# Patient Record
Sex: Female | Born: 1960 | Race: Black or African American | Hispanic: No | Marital: Single | Smoking: Current every day smoker
Health system: Southern US, Community
[De-identification: ages and names within clinical notes are randomized; demographics above are authoritative.]

## PROBLEM LIST (undated history)

## (undated) DIAGNOSIS — F329 Major depressive disorder, single episode, unspecified: Secondary | ICD-10-CM

## (undated) DIAGNOSIS — F32A Depression, unspecified: Secondary | ICD-10-CM

## (undated) DIAGNOSIS — J449 Chronic obstructive pulmonary disease, unspecified: Secondary | ICD-10-CM

## (undated) DIAGNOSIS — I10 Essential (primary) hypertension: Secondary | ICD-10-CM

## (undated) DIAGNOSIS — J439 Emphysema, unspecified: Secondary | ICD-10-CM

## (undated) DIAGNOSIS — I519 Heart disease, unspecified: Secondary | ICD-10-CM

## (undated) HISTORY — DX: Heart disease, unspecified: I51.9

## (undated) HISTORY — DX: Emphysema, unspecified: J43.9

---

## 2003-07-16 ENCOUNTER — Other Ambulatory Visit: Payer: Self-pay

## 2007-01-23 ENCOUNTER — Emergency Department: Payer: Self-pay | Admitting: Emergency Medicine

## 2007-07-04 ENCOUNTER — Emergency Department: Payer: Self-pay | Admitting: Emergency Medicine

## 2008-10-16 ENCOUNTER — Emergency Department: Payer: Self-pay | Admitting: Emergency Medicine

## 2012-10-10 ENCOUNTER — Emergency Department: Payer: Self-pay | Admitting: Emergency Medicine

## 2018-05-15 ENCOUNTER — Other Ambulatory Visit: Payer: Self-pay

## 2018-05-15 DIAGNOSIS — F17290 Nicotine dependence, other tobacco product, uncomplicated: Secondary | ICD-10-CM | POA: Diagnosis present

## 2018-05-15 DIAGNOSIS — R091 Pleurisy: Secondary | ICD-10-CM | POA: Diagnosis not present

## 2018-05-15 DIAGNOSIS — Z23 Encounter for immunization: Secondary | ICD-10-CM

## 2018-05-15 DIAGNOSIS — J439 Emphysema, unspecified: Secondary | ICD-10-CM | POA: Diagnosis present

## 2018-05-15 DIAGNOSIS — Z8249 Family history of ischemic heart disease and other diseases of the circulatory system: Secondary | ICD-10-CM

## 2018-05-15 DIAGNOSIS — Z791 Long term (current) use of non-steroidal anti-inflammatories (NSAID): Secondary | ICD-10-CM

## 2018-05-15 DIAGNOSIS — Z7951 Long term (current) use of inhaled steroids: Secondary | ICD-10-CM

## 2018-05-15 DIAGNOSIS — Z716 Tobacco abuse counseling: Secondary | ICD-10-CM

## 2018-05-15 DIAGNOSIS — J189 Pneumonia, unspecified organism: Secondary | ICD-10-CM | POA: Diagnosis present

## 2018-05-15 DIAGNOSIS — F329 Major depressive disorder, single episode, unspecified: Secondary | ICD-10-CM | POA: Diagnosis present

## 2018-05-15 DIAGNOSIS — I10 Essential (primary) hypertension: Secondary | ICD-10-CM | POA: Diagnosis present

## 2018-05-15 DIAGNOSIS — Z825 Family history of asthma and other chronic lower respiratory diseases: Secondary | ICD-10-CM

## 2018-05-15 DIAGNOSIS — R0781 Pleurodynia: Secondary | ICD-10-CM | POA: Diagnosis not present

## 2018-05-15 DIAGNOSIS — Z79899 Other long term (current) drug therapy: Secondary | ICD-10-CM

## 2018-05-15 NOTE — ED Triage Notes (Signed)
Reports pain down left side, especially with deep breathing since 9 pm.

## 2018-05-16 ENCOUNTER — Emergency Department: Payer: Medicare Other

## 2018-05-16 ENCOUNTER — Other Ambulatory Visit: Payer: Self-pay

## 2018-05-16 ENCOUNTER — Inpatient Hospital Stay
Admission: EM | Admit: 2018-05-16 | Discharge: 2018-05-18 | DRG: 193 | Disposition: A | Discharge status: Home / Self Care | Payer: Medicare Other | Attending: Internal Medicine | Admitting: Internal Medicine

## 2018-05-16 DIAGNOSIS — J189 Pneumonia, unspecified organism: Secondary | ICD-10-CM

## 2018-05-16 DIAGNOSIS — R0781 Pleurodynia: Secondary | ICD-10-CM

## 2018-05-16 DIAGNOSIS — R091 Pleurisy: Secondary | ICD-10-CM | POA: Diagnosis present

## 2018-05-16 HISTORY — DX: Major depressive disorder, single episode, unspecified: F32.9

## 2018-05-16 HISTORY — DX: Chronic obstructive pulmonary disease, unspecified: J44.9

## 2018-05-16 HISTORY — DX: Depression, unspecified: F32.A

## 2018-05-16 HISTORY — DX: Essential (primary) hypertension: I10

## 2018-05-16 LAB — COMPREHENSIVE METABOLIC PANEL
ALT: 18 U/L (ref 0–44)
AST: 18 U/L (ref 15–41)
Albumin: 4 g/dL (ref 3.5–5.0)
Alkaline Phosphatase: 57 U/L (ref 38–126)
Anion gap: 9 (ref 5–15)
BUN: 11 mg/dL (ref 6–20)
CO2: 29 mmol/L (ref 22–32)
CREATININE: 1.03 mg/dL — AB (ref 0.44–1.00)
Calcium: 9.1 mg/dL (ref 8.9–10.3)
Chloride: 99 mmol/L (ref 98–111)
GFR calc Af Amer: >60 mL/min (ref >60)
GFR, EST NON AFRICAN AMERICAN: 60 mL/min — AB (ref >60)
Glucose, Bld: 104 mg/dL — ABNORMAL HIGH (ref 70–99)
Potassium: 3.4 mmol/L — ABNORMAL LOW (ref 3.5–5.1)
Sodium: 137 mmol/L (ref 135–145)
Total Bilirubin: 0.8 mg/dL (ref 0.3–1.2)
Total Protein: 7.1 g/dL (ref 6.5–8.1)

## 2018-05-16 LAB — CBC WITH DIFFERENTIAL/PLATELET
Abs Immature Granulocytes: 0.02 10*3/uL (ref 0.00–0.07)
BASOS PCT: 0 %
Basophils Absolute: 0 10*3/uL (ref 0.0–0.1)
Eosinophils Absolute: 0.1 10*3/uL (ref 0.0–0.5)
Eosinophils Relative: 1 %
HCT: 46.7 % — ABNORMAL HIGH (ref 36.0–46.0)
Hemoglobin: 16 g/dL — ABNORMAL HIGH (ref 12.0–15.0)
Immature Granulocytes: 0 %
Lymphocytes Relative: 24 %
Lymphs Abs: 1.9 10*3/uL (ref 0.7–4.0)
MCH: 31.6 pg (ref 26.0–34.0)
MCHC: 34.3 g/dL (ref 30.0–36.0)
MCV: 92.3 fL (ref 80.0–100.0)
MONOS PCT: 7 %
Monocytes Absolute: 0.6 10*3/uL (ref 0.1–1.0)
Neutro Abs: 5.1 10*3/uL (ref 1.7–7.7)
Neutrophils Relative %: 68 %
Platelets: 212 10*3/uL (ref 150–400)
RBC: 5.06 MIL/uL (ref 3.87–5.11)
RDW: 12.2 % (ref 11.5–15.5)
WBC: 7.7 10*3/uL (ref 4.0–10.5)
nRBC: 0 % (ref 0.0–0.2)

## 2018-05-16 LAB — TROPONIN I
Troponin I: <0.03 ng/mL (ref <0.03)
Troponin I: <0.03 ng/mL (ref <0.03)

## 2018-05-16 LAB — FIBRIN DERIVATIVES D-DIMER (ARMC ONLY): FIBRIN DERIVATIVES D-DIMER (ARMC): 440.18 ng{FEU}/mL (ref 0.00–499.00)

## 2018-05-16 LAB — SEDIMENTATION RATE: Sed Rate: 11 mm/hr (ref 0–30)

## 2018-05-16 MED ORDER — AZITHROMYCIN 500 MG PO TABS
500.0000 mg | ORAL_TABLET | Freq: Every day | ORAL | Status: AC
  Administered 2018-05-16: 500 mg via ORAL
  Filled 2018-05-16: qty 1

## 2018-05-16 MED ORDER — ALBUTEROL SULFATE (2.5 MG/3ML) 0.083% IN NEBU: 2.5000 mg | INHALATION_SOLUTION | RESPIRATORY_TRACT | Status: DC | PRN

## 2018-05-16 MED ORDER — SODIUM CHLORIDE 0.9 % IV SOLN
1.0000 g | Freq: Once | INTRAVENOUS | Status: AC
  Administered 2018-05-16: 1 g via INTRAVENOUS
  Filled 2018-05-16: qty 10

## 2018-05-16 MED ORDER — METHYLPREDNISOLONE SODIUM SUCC 125 MG IJ SOLR
60.0000 mg | INTRAMUSCULAR | Status: AC
  Administered 2018-05-16: 60 mg via INTRAVENOUS
  Filled 2018-05-16: qty 2

## 2018-05-16 MED ORDER — DOCUSATE SODIUM 100 MG PO CAPS
100.0000 mg | ORAL_CAPSULE | Freq: Two times a day (BID) | ORAL | Status: DC
  Administered 2018-05-16 – 2018-05-18 (×4): 100 mg via ORAL
  Filled 2018-05-16 (×4): qty 1

## 2018-05-16 MED ORDER — PNEUMOCOCCAL VAC POLYVALENT 25 MCG/0.5ML IJ INJ
0.5000 mL | INJECTION | INTRAMUSCULAR | Status: AC
  Administered 2018-05-17: 0.5 mL via INTRAMUSCULAR
  Filled 2018-05-16: qty 0.5

## 2018-05-16 MED ORDER — POLYETHYLENE GLYCOL 3350 17 G PO PACK: 17.0000 g | PACK | Freq: Every day | ORAL | Status: DC | PRN

## 2018-05-16 MED ORDER — ACETAMINOPHEN 650 MG RE SUPP: 650.0000 mg | Freq: Four times a day (QID) | RECTAL | Status: DC | PRN

## 2018-05-16 MED ORDER — SODIUM CHLORIDE 0.9 % IV SOLN
500.0000 mg | Freq: Once | INTRAVENOUS | Status: AC
  Administered 2018-05-16: 500 mg via INTRAVENOUS
  Filled 2018-05-16: qty 500

## 2018-05-16 MED ORDER — LORATADINE 10 MG PO TABS
10.0000 mg | ORAL_TABLET | Freq: Every day | ORAL | Status: DC
  Administered 2018-05-16 – 2018-05-18 (×3): 10 mg via ORAL
  Filled 2018-05-16 (×3): qty 1

## 2018-05-16 MED ORDER — IBUPROFEN 800 MG PO TABS
800.0000 mg | ORAL_TABLET | Freq: Four times a day (QID) | ORAL | Status: DC | PRN
  Filled 2018-05-16: qty 1

## 2018-05-16 MED ORDER — ONDANSETRON HCL 4 MG PO TABS: 4.0000 mg | ORAL_TABLET | Freq: Four times a day (QID) | ORAL | Status: DC | PRN

## 2018-05-16 MED ORDER — IOHEXOL 350 MG/ML SOLN
75.0000 mL | Freq: Once | INTRAVENOUS | Status: AC | PRN
  Administered 2018-05-16: 75 mL via INTRAVENOUS

## 2018-05-16 MED ORDER — CHLORTHALIDONE 25 MG PO TABS
25.0000 mg | ORAL_TABLET | ORAL | Status: DC
  Administered 2018-05-17 – 2018-05-18 (×2): 25 mg via ORAL
  Filled 2018-05-16 (×2): qty 1

## 2018-05-16 MED ORDER — ENOXAPARIN SODIUM 40 MG/0.4ML ~~LOC~~ SOLN
40.0000 mg | SUBCUTANEOUS | Status: DC
  Filled 2018-05-16 (×2): qty 0.4

## 2018-05-16 MED ORDER — NICARDIPINE HCL IN NACL 20-0.86 MG/200ML-% IV SOLN: 0.0000 mg/h | INTRAVENOUS | Status: DC

## 2018-05-16 MED ORDER — AZITHROMYCIN 500 MG PO TABS: 250.0000 mg | ORAL_TABLET | Freq: Every day | ORAL | Status: DC

## 2018-05-16 MED ORDER — MOMETASONE FURO-FORMOTEROL FUM 200-5 MCG/ACT IN AERO
2.0000 | INHALATION_SPRAY | Freq: Two times a day (BID) | RESPIRATORY_TRACT | Status: DC
  Administered 2018-05-16 – 2018-05-17 (×3): 2 via RESPIRATORY_TRACT
  Filled 2018-05-16: qty 8.8

## 2018-05-16 MED ORDER — POTASSIUM CHLORIDE CRYS ER 20 MEQ PO TBCR
40.0000 meq | EXTENDED_RELEASE_TABLET | Freq: Once | ORAL | Status: AC
  Administered 2018-05-16: 40 meq via ORAL
  Filled 2018-05-16: qty 2

## 2018-05-16 MED ORDER — GABAPENTIN 300 MG PO CAPS
300.0000 mg | ORAL_CAPSULE | Freq: Every day | ORAL | Status: DC
  Administered 2018-05-16 – 2018-05-17 (×2): 300 mg via ORAL
  Filled 2018-05-16 (×3): qty 1

## 2018-05-16 MED ORDER — INFLUENZA VAC SPLIT QUAD 0.5 ML IM SUSY
0.5000 mL | PREFILLED_SYRINGE | INTRAMUSCULAR | Status: AC
  Administered 2018-05-17: 0.5 mL via INTRAMUSCULAR
  Filled 2018-05-16: qty 0.5

## 2018-05-16 MED ORDER — KETOROLAC TROMETHAMINE 30 MG/ML IJ SOLN: 30.0000 mg | Freq: Four times a day (QID) | INTRAMUSCULAR | Status: DC | PRN

## 2018-05-16 MED ORDER — AZITHROMYCIN 250 MG PO TABS
250.0000 mg | ORAL_TABLET | Freq: Every day | ORAL | Status: DC
  Administered 2018-05-17 – 2018-05-18 (×2): 250 mg via ORAL
  Filled 2018-05-16 (×2): qty 1

## 2018-05-16 MED ORDER — ACETAMINOPHEN 325 MG PO TABS: 650.0000 mg | ORAL_TABLET | Freq: Four times a day (QID) | ORAL | Status: DC | PRN

## 2018-05-16 MED ORDER — ONDANSETRON HCL 4 MG/2ML IJ SOLN: 4.0000 mg | Freq: Four times a day (QID) | INTRAMUSCULAR | Status: DC | PRN

## 2018-05-16 NOTE — ED Notes (Signed)
Pt to room 12, Morrie Sheldon RN aware.

## 2018-05-16 NOTE — ED Notes (Signed)
Pt provided lunch tray per request.

## 2018-05-16 NOTE — ED Provider Notes (Signed)
[place], [facility] Emergency Department Provider Note   ____________________________________________   First MD Initiated Contact with Patient 05/16/18 [phone]     (approximate)  I have reviewed the triage vital signs and the nursing notes.   HISTORY  Chief Complaint Shortness of Breath    HPI Penny Dickson is a 58 y.o. female patient reports left-sided chest pain with deep breathing starting about 9:00 last night.  Came on fairly suddenly rapidly.  She is not having any fever.  She is having a slight cough.  Says last time she had something like that she had pneumonia.   Past Medical History:  Diagnosis Date  . COPD (chronic obstructive pulmonary disease) (HCC)   . Depression   . Hypertension     Patient Active Problem List   Diagnosis Date Noted  . Pleurisy 05/16/2018    History reviewed. No pertinent surgical history.  Prior to Admission medications   Medication Sig Start Date End Date Taking? Authorizing Provider  albuterol (PROVENTIL) (2.5 MG/3ML) 0.083% nebulizer solution Take 2.5 mg by nebulization every 4 (four) hours as needed for shortness of breath or cough. 04/29/18  Yes [provider]  cetirizine (ZYRTEC) 10 MG tablet Take 10 mg by mouth daily. 07/09/15  Yes [provider]  chlorthalidone (HYGROTON) 25 MG tablet Take 25 mg by mouth every morning. 01/13/18 08/31/18 Yes [provider]  Fluticasone-Salmeterol (ADVAIR) 500-50 MCG/DOSE AEPB Inhale 1 puff into the lungs 2 (two) times daily. 02/02/18  Yes [provider]  gabapentin (NEURONTIN) 300 MG capsule Take 300 mg by mouth daily. 01/13/18  Yes [provider]  ipratropium (ATROVENT) 0.02 % nebulizer solution Inhale 2.5 mLs into the lungs 4 (four) times daily. 03/25/18  Yes [provider]  Naproxen Sodium 220 MG CAPS Take 440 mg by mouth 2 (two) times daily.   Yes [provider]  potassium chloride SA (K-DUR,KLOR-CON) 20 MEQ tablet  Take 40 mEq by mouth daily.  10/29/17 10/29/18 Yes [provider]  venlafaxine (EFFEXOR) 75 MG tablet Take 75 mg by mouth daily. 10/28/17  Yes [provider]  venlafaxine XR (EFFEXOR-XR) 150 MG 24 hr capsule Take 150 mg by mouth daily. 10/28/17  Yes [provider]    Allergies Patient has no known allergies.  Family History  Problem Relation Age of Onset  . COPD Mother   . Heart failure Mother   . CAD Father     Social History Social History   Tobacco Use  . Smoking status: Current Every Day Smoker    Types: Cigars  . Smokeless tobacco: Never Used  Substance Use Topics  . Alcohol use: Never    Frequency: Never  . Drug use: Not on file    Review of Systems  Constitutional: No fever/chills Eyes: No visual changes. ENT: No sore throat. Cardiovascular: See HPI Respiratory: See HPI Gastrointestinal: No abdominal pain.  No nausea, no vomiting.  No diarrhea.  No constipation. Genitourinary: Negative for dysuria. Musculoskeletal: Negative for back pain. Skin: Negative for rash. Neurological: Negative for headaches, focal weakness   ____________________________________________   PHYSICAL EXAM:  VITAL SIGNS: ED Triage Vitals  Enc Vitals Group     BP 05/15/18 2339 120/87     Pulse Rate 05/15/18 2339 96     Resp 05/15/18 2339 (!) 22     Temp 05/15/18 2339 98.6 F (37 C)     Temp Source 05/15/18 2339 Oral     SpO2 05/15/18 2339 96 %  Weight 05/15/18 2337 191 lb (86.6 kg)     Height 05/15/18 2337 5\' 3"  (1.6 m)     Head Circumference --      Peak Flow --      Pain Score --      Pain Loc --      Pain Edu? --      Excl. in GC? --     Constitutional: Alert and oriented.  Looks uncomfortable Eyes: Conjunctivae are normal.  Head: Atraumatic. Nose: No congestion/rhinnorhea. Mouth/Throat: Mucous membranes are moist.  Oropharynx non-erythematous. Neck: No stridor Cardiovascular: Normal rate, regular rhythm. Grossly normal heart sounds.   Good peripheral circulation. Respiratory: Normal respiratory effort.  No retractions. Lungs CTAB. Gastrointestinal: Soft and nontender. No distention. No abdominal bruits. No CVA tenderness. Musculoskeletal: No lower extremity tenderness nor edema.   Neurologic:  Normal speech and language. No gross focal neurologic deficits are appreciated. No gait instability. Skin:  Skin is warm, dry and intact. No rash noted. Psychiatric: Mood and affect are normal. Speech and behavior are normal.  ____________________________________________   LABS (all labs ordered are listed, but only abnormal results are displayed)  Labs Reviewed  CBC WITH DIFFERENTIAL/PLATELET - Abnormal; Notable for the following components:      Result Value   Hemoglobin 16.0 (*)    HCT 46.7 (*)    All other components within normal limits  COMPREHENSIVE METABOLIC PANEL - Abnormal; Notable for the following components:   Potassium 3.4 (*)    Glucose, Bld 104 (*)    Creatinine, Ser 1.03 (*)    GFR calc non Af Amer 60 (*)    All other components within normal limits  CULTURE, BLOOD (ROUTINE X 2)  CULTURE, BLOOD (ROUTINE X 2)  FIBRIN DERIVATIVES D-DIMER ([facility] ONLY)  TROPONIN I  ANA COMPREHENSIVE PANEL  SEDIMENTATION RATE  C-REACTIVE PROTEIN  ANGIOTENSIN CONVERTING ENZYME  RHEUMATOID FACTOR  CYCLIC CITRUL PEPTIDE ANTIBODY, IGG/IGA   ____________________________________________  EKG EKG read and interpreted by me shows normal sinus rhythm at 90 normal axis no acute ST-T wave changes possible left atrial enlargement.  ____________________________________________  RADIOLOGY  ED MD interpretation: X-ray read by radiology reviewed by me shows no acute disease CT scan read by radiology reviewed by me shows some scattered opacities and nodules.  Official radiology report(s): Dg Chest 2 View  Result Date: 05/16/2018 CLINICAL DATA:  Left chest pain, shortness of breath EXAM: CHEST - 2 VIEW COMPARISON:  None. FINDINGS:  Lungs are clear.  No pleural effusion or pneumothorax. The heart is normal in size. Visualized osseous structures are within normal limits. IMPRESSION: Normal chest radiographs. Electronically Signed   By: Charline Bills M.D.   On: 05/16/2018 00:20   Ct Angio Chest Pe W And/or Wo Contrast  Result Date: 05/16/2018 CLINICAL DATA:  58 year old with left chest pain. Shortness of breath. EXAM: CT ANGIOGRAPHY CHEST WITH CONTRAST TECHNIQUE: Multidetector CT imaging of the chest was performed using the standard protocol during bolus administration of intravenous contrast. Multiplanar CT image reconstructions and MIPs were obtained to evaluate the vascular anatomy. CONTRAST:  45mL OMNIPAQUE IOHEXOL 350 MG/ML SOLN COMPARISON:  None. FINDINGS: Cardiovascular: Satisfactory opacification of the pulmonary arteries to the segmental level. No evidence of pulmonary embolism. Normal heart size. No pericardial effusion. Normal caliber of the thoracic aorta. Atherosclerotic calcifications at the aortic arch and origin of the great vessels. Mediastinum/Nodes: No significant mediastinal or hilar lymphadenopathy. Left hilar tissue measures roughly 0.9 cm in the short axis on sequence 5, image 127.  No axillary lymph node enlargement. Esophagus is unremarkable. Lungs/Pleura: Trachea and mainstem bronchi are patent. Peripheral densities in the posterior right lower lobe best seen on sequence 6, image 64. Evidence for mild centrilobular emphysema in the upper lungs. 4 mm peripheral nodule in the right upper lobe on sequence 6 image 16. Peripheral irregular densities in the posterior left lower lobe near the superior segment, best seen on sequence 6, image 55. Focal thickening along the left major fissure on sequence 6, image 53 probably represents a subpleural lymph node. Subtle density in the right upper lobe near the right major fissure on sequence 6, image 33 is nonspecific. Upper Abdomen: No acute abnormality in the upper abdomen.  Musculoskeletal: No acute bone abnormality. Review of the MIP images confirms the above findings. IMPRESSION: 1. Negative for a pulmonary embolism. 2. Poorly defined peripheral opacities in both lower lobes, right side greater than left. Findings could be related to infectious or inflammatory process of unknown age. Acute process can not be excluded. Based on the underlying emphysema, recommend 3 month follow-up chest CT to ensure resolution of these densities. 3. Few scattered peripheral and pleural-based nodular densities, largest measuring roughly 4 mm. These small nodular densities are nonspecific. No follow-up needed if patient is low-risk (and has no known or suspected primary neoplasm). Non-contrast chest CT can be considered in 12 months if patient is high-risk. This recommendation follows the consensus statement: Guidelines for Management of Incidental Pulmonary Nodules Detected on CT Images: From the Fleischner Society 2017; Radiology 2017; 284:228-243. 4. Aortic Atherosclerosis (ICD10-I70.0) and Emphysema (ICD10-J43.9). Electronically Signed   By: Richarda OverlieAdam  Henn M.D.   On: 05/16/2018 09:52    ____________________________________________   PROCEDURES  Procedure(s) performed:   Procedures  Critical Care performed:   ____________________________________________   INITIAL IMPRESSION / ASSESSMENT AND PLAN / ED COURSE  When patient walks she desats all the way down to 86.  I had discussed her with Dr. Meredeth IdeFleming earlier.  He was going to see her in the office if she did not desaturate but she is desaturating.  This could perhaps be an early pneumonia.  She is coughing we will get her in the hospital some IV antibiotics going and Dr. Meredeth IdeFleming can check her out there.         ____________________________________________   FINAL CLINICAL IMPRESSION(S) / ED DIAGNOSES  Final diagnoses:  Pleurodynia  Community acquired pneumonia, unspecified laterality     ED Discharge Orders    None         Note:  This document was prepared using Dragon voice recognition software and may include unintentional dictation errors.    Arnaldo NatalMalinda, Charlann Wayne F, MD 05/16/18 1538

## 2018-05-16 NOTE — ED Notes (Signed)
Pt provide graham crackers and peanut butter per request.

## 2018-05-16 NOTE — ED Notes (Signed)
Patient transported to CT 

## 2018-05-16 NOTE — Care Management Obs Status (Signed)
MEDICARE OBSERVATION STATUS NOTIFICATION   Patient Details  Name: Penny Dickson MRN: [id number] Date of Birth: [birth year]   Medicare Observation Status Notification Given:  Yes    Allayne Butcher, RN 05/16/2018, 4:14 PM

## 2018-05-16 NOTE — ED Notes (Signed)
Pt ambulated approximately 100 feet, oxygen saturation 87-92% during ambulation.  Pt dyspneic with ambulation.  EDP notified.

## 2018-05-16 NOTE — ED Notes (Signed)
Pt ambulatory to toilet independently. 

## 2018-05-16 NOTE — ED Notes (Signed)
Resumed care from [facility].  Pt alert.  nsr on monitor.  Skin warm and dry.  Iv in place.   Pt waiting on admission.

## 2018-05-16 NOTE — ED Notes (Signed)
Pt alert  Continues to wait on admission bed.  Iv in place

## 2018-05-16 NOTE — ED Notes (Signed)
Report called to Lona Kettle floor nurse

## 2018-05-16 NOTE — H&P (Signed)
[facility] [place] NAME: Penny Dickson    MR#:  [id number]  DATE OF BIRTH:  1960/12/25  DATE OF ADMISSION:  05/16/2018  PRIMARY CARE PHYSICIAN: System, Pcp Not In   REQUESTING/REFERRING PHYSICIAN: Dr. Conni Slipper  CHIEF COMPLAINT:   Chief Complaint  Patient presents with  . Shortness of Breath    HISTORY OF PRESENT ILLNESS:  Penny Dickson  is a 58 y.o. female with a known history of COPD not on home oxygen, hypertension and depression presents to hospital secondary to worsening left-sided chest pain on deep breath. Patient states that she was doing well up until last week.  She started noticing shortness of breath with minimal exertion which is unusual for her.  Occasional dry cough.  Denies any fevers but some chills.  No recent travel or exposure to anybody sick.  Since last night around 9 PM she started having significant left-sided pain on deep inspiration.  No known cardiac history.  Troponins are negative.  EKG without any acute findings.  CT angiogram of the chest showing some pleural-based nodular densities which could be nonspecific or related to acute infectious or inflammatory process.  She is being admitted for uncontrolled pain secondary to pleurisy.  PAST MEDICAL HISTORY:   Past Medical History:  Diagnosis Date  . COPD (chronic obstructive pulmonary disease) ([place])   . Depression   . Hypertension     PAST SURGICAL HISTORY:  History reviewed. No pertinent surgical history.  SOCIAL HISTORY:   Social History   Tobacco Use  . Smoking status: Current Every Day Smoker    Types: Cigars  . Smokeless tobacco: Never Used  Substance Use Topics  . Alcohol use: Never    Frequency: Never    FAMILY HISTORY:   Family History  Problem Relation Age of Onset  . COPD Mother   . Heart failure Mother   . CAD Father     DRUG ALLERGIES:  No Known Allergies  REVIEW OF SYSTEMS:   Review of Systems  Constitutional:  Positive for malaise/fatigue. Negative for chills, fever and weight loss.  HENT: Negative for ear discharge, ear pain, hearing loss, nosebleeds and tinnitus.   Eyes: Negative for blurred vision, double vision and photophobia.  Respiratory: Positive for cough and shortness of breath. Negative for hemoptysis and wheezing.   Cardiovascular: Positive for chest pain. Negative for palpitations, orthopnea and leg swelling.  Gastrointestinal: Negative for abdominal pain, constipation, diarrhea, heartburn, melena, nausea and vomiting.  Genitourinary: Negative for dysuria, frequency, hematuria and urgency.  Musculoskeletal: Negative for back pain, myalgias and neck pain.  Skin: Negative for rash.  Neurological: Negative for dizziness, tingling, tremors, sensory change, speech change, focal weakness and headaches.  Endo/Heme/Allergies: Does not bruise/bleed easily.  Psychiatric/Behavioral: Negative for depression.    MEDICATIONS AT HOME:   Prior to Admission medications   Medication Sig Start Date End Date Taking? Authorizing Provider  albuterol (PROVENTIL HFA;VENTOLIN HFA) 108 (90 Base) MCG/ACT inhaler Inhale 2 puffs into the lungs every 6 (six) hours as needed for wheezing. 03/25/18  Yes [provider]  albuterol (PROVENTIL) (2.5 MG/3ML) 0.083% nebulizer solution Take 2.5 mg by nebulization every 4 (four) hours as needed for shortness of breath or cough. 04/29/18  Yes [provider]  cetirizine (ZYRTEC) 10 MG tablet Take 10 mg by mouth daily. 07/09/15  Yes [provider]  chlorthalidone (HYGROTON) 25 MG tablet Take 25 mg by mouth every morning. 01/13/18 08/31/18 Yes [provider]  Fluticasone-Salmeterol (ADVAIR) 500-50 MCG/DOSE AEPB Inhale 1 puff into the lungs 2 (two) times daily. 02/02/18  Yes [provider]  gabapentin (NEURONTIN) 300 MG capsule Take 300 mg by mouth daily. 01/13/18  Yes [provider]  potassium chloride SA (K-DUR,KLOR-CON) 20  MEQ tablet Take 80 mEq by mouth daily. 10/29/17 10/29/18 Yes [provider]      VITAL SIGNS:  Blood pressure 117/76, pulse 97, temperature 98.6 F (37 C), temperature source Oral, resp. rate (!) 23, height _0  (1.6 m), weight 86.6 kg, SpO2 97 %.  PHYSICAL EXAMINATION:   Physical Exam  GENERAL:  58 y.o.-year-old patient lying in the bed with no acute distress.  EYES: Pupils equal, round, reactive to light and accommodation. No scleral icterus. Extraocular muscles intact.  HEENT: Head atraumatic, normocephalic. Oropharynx and nasopharynx clear.  NECK:  Supple, no jugular venous distention. No thyroid enlargement, no tenderness.  LUNGS: Normal breath sounds bilaterally, no wheezing, rales,rhonchi or crepitation. No use of accessory muscles of respiration.  CARDIOVASCULAR: S1, S2 normal. No murmurs, rubs, or gallops. No Chest wall tenderness ABDOMEN: Soft, nontender, nondistended. Bowel sounds present. No organomegaly or mass.  EXTREMITIES: No pedal edema, cyanosis, or clubbing.  NEUROLOGIC: Cranial nerves II through XII are intact. Muscle strength 5/5 in all extremities. Sensation intact. Gait not checked.  PSYCHIATRIC: The patient is alert and oriented x 3.  SKIN: No obvious rash, lesion, or ulcer.   LABORATORY PANEL:   CBC Recent Labs  Lab 05/16/18 0842  WBC 7.7  HGB 16.0*  HCT 46.7*  PLT 212   ------------------------------------------------------------------------------------------------------------------  Chemistries  Recent Labs  Lab 05/16/18 0842  NA 137  K 3.4*  CL 99  CO2 29  GLUCOSE 104*  BUN 11  CREATININE 1.03*  CALCIUM 9.1  AST 18  ALT 18  ALKPHOS 57  BILITOT 0.8   ------------------------------------------------------------------------------------------------------------------  Cardiac Enzymes Recent Labs  Lab 05/16/18 0842  TROPONINI <0.03    ------------------------------------------------------------------------------------------------------------------  RADIOLOGY:  Dg Chest 2 View  Result Date: 05/16/2018 CLINICAL DATA:  Left chest pain, shortness of breath EXAM: CHEST - 2 VIEW COMPARISON:  None. FINDINGS: Lungs are clear.  No pleural effusion or pneumothorax. The heart is normal in size. Visualized osseous structures are within normal limits. IMPRESSION: Normal chest radiographs. Electronically Signed   By: Julian Hy M.D.   On: 05/16/2018 00:20   Ct Angio Chest Pe W And/or Wo Contrast  Result Date: 05/16/2018 CLINICAL DATA:  58 year old with left chest pain. Shortness of breath. EXAM: CT ANGIOGRAPHY CHEST WITH CONTRAST TECHNIQUE: Multidetector CT imaging of the chest was performed using the standard protocol during bolus administration of intravenous contrast. Multiplanar CT image reconstructions and MIPs were obtained to evaluate the vascular anatomy. CONTRAST:  3m OMNIPAQUE IOHEXOL 350 MG/ML SOLN COMPARISON:  None. FINDINGS: Cardiovascular: Satisfactory opacification of the pulmonary arteries to the segmental level. No evidence of pulmonary embolism. Normal heart size. No pericardial effusion. Normal caliber of the thoracic aorta. Atherosclerotic calcifications at the aortic arch and origin of the great vessels. Mediastinum/Nodes: No significant mediastinal or hilar lymphadenopathy. Left hilar tissue measures roughly 0.9 cm in the short axis on sequence 5, image 127. No axillary lymph node enlargement. Esophagus is unremarkable. Lungs/Pleura: Trachea and mainstem bronchi are patent. Peripheral densities in the posterior right lower lobe best seen on sequence 6, image 64. Evidence for mild centrilobular emphysema in the upper lungs. 4 mm peripheral nodule in the right upper lobe on sequence 6 image 16. Peripheral irregular densities in  the posterior left lower lobe near the superior segment, best seen on sequence 6, image 55.  Focal thickening along the left major fissure on sequence 6, image 53 probably represents a subpleural lymph node. Subtle density in the right upper lobe near the right major fissure on sequence 6, image 33 is nonspecific. Upper Abdomen: No acute abnormality in the upper abdomen. Musculoskeletal: No acute bone abnormality. Review of the MIP images confirms the above findings. IMPRESSION: 1. Negative for a pulmonary embolism. 2. Poorly defined peripheral opacities in both lower lobes, right side greater than left. Findings could be related to infectious or inflammatory process of unknown age. Acute process can not be excluded. Based on the underlying emphysema, recommend 3 month follow-up chest CT to ensure resolution of these densities. 3. Few scattered peripheral and pleural-based nodular densities, largest measuring roughly 4 mm. These small nodular densities are nonspecific. No follow-up needed if patient is low-risk (and has no known or suspected primary neoplasm). Non-contrast chest CT can be considered in 12 months if patient is high-risk. This recommendation follows the consensus statement: Guidelines for Management of Incidental Pulmonary Nodules Detected on CT Images: From the Fleischner Society 2017; Radiology 2017; 284:228-243. 4. Aortic Atherosclerosis (ICD10-I70.0) and Emphysema (ICD10-J43.9). Electronically Signed   By: Markus Daft M.D.   On: 05/16/2018 09:52    EKG:   Orders placed or performed during the hospital encounter of 05/16/18  . ED EKG  . ED EKG  . EKG 12-Lead  . EKG 12-Lead  . ED EKG  . ED EKG    IMPRESSION AND PLAN:   Penny Dickson  is a 58 y.o. female with a known history of COPD not on home oxygen, hypertension and depression presents to hospital secondary to worsening left-sided chest pain on deep breath.  1.  Left-sided pleurisy-had upper respiratory infection last week.  CT chest with pleural-based nodular densities could be infectious or inflammatory in  origin. -Agree with Z-Pak, will continue that. -Smoking cessation advised.  Nebs and inhalers for now.  One-time dose of Solu-Medrol -Check ESR, CRP and ANA panel to rule out any underlying rheumatologic conditions. -Ibuprofen and Toradol as needed and monitor.  Admitted under observation. -Pulmonary consult  2.  Hypertension-stable.  Continue home medications.  3.  Depression-patient on Effexor, verify dose and restart home medications.  4.  DVT prophylaxis-Lovenox   All the records are reviewed and case discussed with ED provider. Management plans discussed with the patient, family and they are in agreement.  CODE STATUS: Full Code  TOTAL TIME TAKING CARE OF THIS PATIENT: 51 minutes.    Gladstone Lighter M.D on 05/16/2018 at 1:12 PM  Between 7am to 6pm - Pager - [phone]  After 6pm go to [email or web address] - password [email or web address] [facility]  Office  [phone]  CC: Primary care physician; System, Pcp Not In

## 2018-05-17 DIAGNOSIS — J439 Emphysema, unspecified: Secondary | ICD-10-CM | POA: Diagnosis present

## 2018-05-17 DIAGNOSIS — R0781 Pleurodynia: Secondary | ICD-10-CM | POA: Diagnosis present

## 2018-05-17 DIAGNOSIS — R091 Pleurisy: Secondary | ICD-10-CM | POA: Diagnosis present

## 2018-05-17 DIAGNOSIS — Z23 Encounter for immunization: Secondary | ICD-10-CM | POA: Diagnosis present

## 2018-05-17 DIAGNOSIS — Z79899 Other long term (current) drug therapy: Secondary | ICD-10-CM | POA: Diagnosis not present

## 2018-05-17 DIAGNOSIS — F17290 Nicotine dependence, other tobacco product, uncomplicated: Secondary | ICD-10-CM | POA: Diagnosis present

## 2018-05-17 DIAGNOSIS — Z8249 Family history of ischemic heart disease and other diseases of the circulatory system: Secondary | ICD-10-CM | POA: Diagnosis not present

## 2018-05-17 DIAGNOSIS — I10 Essential (primary) hypertension: Secondary | ICD-10-CM | POA: Diagnosis present

## 2018-05-17 DIAGNOSIS — Z716 Tobacco abuse counseling: Secondary | ICD-10-CM | POA: Diagnosis not present

## 2018-05-17 DIAGNOSIS — Z791 Long term (current) use of non-steroidal anti-inflammatories (NSAID): Secondary | ICD-10-CM | POA: Diagnosis not present

## 2018-05-17 DIAGNOSIS — Z825 Family history of asthma and other chronic lower respiratory diseases: Secondary | ICD-10-CM | POA: Diagnosis not present

## 2018-05-17 DIAGNOSIS — J189 Pneumonia, unspecified organism: Secondary | ICD-10-CM | POA: Diagnosis present

## 2018-05-17 DIAGNOSIS — F329 Major depressive disorder, single episode, unspecified: Secondary | ICD-10-CM | POA: Diagnosis present

## 2018-05-17 DIAGNOSIS — Z7951 Long term (current) use of inhaled steroids: Secondary | ICD-10-CM | POA: Diagnosis not present

## 2018-05-17 LAB — BASIC METABOLIC PANEL
Anion gap: 10 (ref 5–15)
BUN: 13 mg/dL (ref 6–20)
CHLORIDE: 102 mmol/L (ref 98–111)
CO2: 26 mmol/L (ref 22–32)
Calcium: 9.3 mg/dL (ref 8.9–10.3)
Creatinine, Ser: 1.01 mg/dL — ABNORMAL HIGH (ref 0.44–1.00)
GFR calc non Af Amer: >60 mL/min (ref >60)
Glucose, Bld: 225 mg/dL — ABNORMAL HIGH (ref 70–99)
Potassium: 3.5 mmol/L (ref 3.5–5.1)
SODIUM: 138 mmol/L (ref 135–145)

## 2018-05-17 LAB — CBC
HEMATOCRIT: 45.8 % (ref 36.0–46.0)
Hemoglobin: 15.5 g/dL — ABNORMAL HIGH (ref 12.0–15.0)
MCH: 31.4 pg (ref 26.0–34.0)
MCHC: 33.8 g/dL (ref 30.0–36.0)
MCV: 92.7 fL (ref 80.0–100.0)
Platelets: 215 10*3/uL (ref 150–400)
RBC: 4.94 MIL/uL (ref 3.87–5.11)
RDW: 11.9 % (ref 11.5–15.5)
WBC: 4.5 10*3/uL (ref 4.0–10.5)
nRBC: 0 % (ref 0.0–0.2)

## 2018-05-17 LAB — C-REACTIVE PROTEIN: CRP: 7.1 mg/dL — ABNORMAL HIGH (ref <1.0)

## 2018-05-17 LAB — INFLUENZA PANEL BY PCR (TYPE A & B)
Influenza A By PCR: NEGATIVE
Influenza B By PCR: NEGATIVE

## 2018-05-17 LAB — TROPONIN I: Troponin I: <0.03 ng/mL (ref <0.03)

## 2018-05-17 MED ORDER — VENLAFAXINE HCL ER 150 MG PO CP24
150.0000 mg | ORAL_CAPSULE | Freq: Every day | ORAL | Status: DC
  Administered 2018-05-17: 150 mg via ORAL
  Filled 2018-05-17 (×2): qty 1

## 2018-05-17 MED ORDER — POTASSIUM CHLORIDE CRYS ER 20 MEQ PO TBCR
40.0000 meq | EXTENDED_RELEASE_TABLET | Freq: Every day | ORAL | Status: DC
  Administered 2018-05-17 – 2018-05-18 (×2): 40 meq via ORAL
  Filled 2018-05-17 (×2): qty 2

## 2018-05-17 MED ORDER — VENLAFAXINE HCL 37.5 MG PO TABS
75.0000 mg | ORAL_TABLET | Freq: Every day | ORAL | Status: DC
  Administered 2018-05-17: 75 mg via ORAL
  Filled 2018-05-17 (×2): qty 2

## 2018-05-17 NOTE — Progress Notes (Signed)
Pt refused lovenox. Pt educated

## 2018-05-17 NOTE — Progress Notes (Signed)
[facility] - [place] at [facility]   PATIENT NAME: Penny Dickson    MR#:  [id number]  DATE OF BIRTH:  06/10/1960  SUBJECTIVE:  CHIEF COMPLAINT:   Chief Complaint  Patient presents with  . Shortness of Breath   No new complaint this morning.  Shortness of breath improving.  No more pleuritic chest pain.  No fevers.  REVIEW OF SYSTEMS:  Review of Systems  Constitutional: Negative for chills and fever.  HENT: Negative for hearing loss and tinnitus.   Eyes: Negative for blurred vision and double vision.  Respiratory: Positive for cough and shortness of breath. Negative for hemoptysis.   Cardiovascular: Negative for chest pain and palpitations.  Gastrointestinal: Negative for heartburn and nausea.  Genitourinary: Negative for dysuria and urgency.  Musculoskeletal: Negative for myalgias and neck pain.  Skin: Negative for itching and rash.  Neurological: Negative for dizziness and headaches.  Psychiatric/Behavioral: Negative for hallucinations.    DRUG ALLERGIES:  No Known Allergies VITALS:  Blood pressure 116/71, pulse 87, temperature 97.9 F (36.6 C), temperature source Oral, resp. rate 16, height 5\' 3"  (1.6 m), weight 86.6 kg, SpO2 100 %. PHYSICAL EXAMINATION:  Physical Exam  Constitutional: She is oriented to person, place, and time. She appears well-developed and well-nourished.  HENT:  Head: Normocephalic and atraumatic.  Eyes: Pupils are equal, round, and reactive to light. Conjunctivae are normal.  Neck: Normal range of motion. Neck supple. No thyromegaly present.  Cardiovascular: Normal rate, regular rhythm and normal heart sounds.  Respiratory: Effort normal. She has rales.  GI: Soft. Bowel sounds are normal. She exhibits no distension. There is no abdominal tenderness.  Musculoskeletal: Normal range of motion.        General: No deformity or edema.  Neurological: She is alert and oriented to person, place, and time.  Skin: Skin is warm. No  erythema.  Psychiatric: She has a normal mood and affect. Thought content normal.    LABORATORY PANEL:  Female CBC Recent Labs  Lab 05/17/18 0239  WBC 4.5  HGB 15.5*  HCT 45.8  PLT 215   ------------------------------------------------------------------------------------------------------------------ Chemistries  Recent Labs  Lab 05/16/18 0842 05/17/18 0239  NA 137 138  K 3.4* 3.5  CL 99 102  CO2 29 26  GLUCOSE 104* 225*  BUN 11 13  CREATININE 1.03* 1.01*  CALCIUM 9.1 9.3  AST 18  --   ALT 18  --   ALKPHOS 57  --   BILITOT 0.8  --    RADIOLOGY:  No results found. ASSESSMENT AND PLAN:   Ebbony Adan  is a 58 y.o. female with a known history of COPD not on home oxygen, hypertension and depression presents to hospital secondary to worsening left-sided chest pain on deep breath.  1.  Left-sided pleurisy-had upper respiratory infection last week. CTA chest done was negative for pulmonary embolism but revealed poorly defined peripheral opacities in both lower lobes, right side greater than left. Findings could be related to infectious or inflammatory process of unknown age. Acute process can not be excluded. Based on the underlying emphysema, Pulmonary consultation for Dr. Meredeth Ide was placed already.  I just called and left a message for Dr. Meredeth Ide regarding the consult as well.  We will follow-up on evaluation and further recommendation.  Suspect patient will likely require follow-up CT scan in 3 months. Patient currently on empiric antibiotics with azithromycin to cover for pneumonitis -Smoking cessation advised.  Nebs and inhalers for now.  One-time dose of Solu-Medrol C-reactive  protein of 7.1.  Erythrocyte sedimentation rate of 11. Ibuprofen and Toradol as needed and monitor.  Admitted under observation.  2.  Hypertension-stable.  Continue home medications.  3.  Depression-patient on Effexor.resumed home regimen.  4.  DVT prophylaxis-Lovenox   All the  records are reviewed and case discussed with Care Management/Social Worker. Management plans discussed with the patient, family and they are in agreement.  CODE STATUS: Full Code  TOTAL TIME TAKING CARE OF THIS PATIENT: 36 minutes.   More than 50% of the time was spent in counseling/coordination of care: YES  POSSIBLE D/C IN 1-2 DAYS, DEPENDING ON CLINICAL CONDITION.   Glen Kesinger M.D on 05/17/2018 at 12:10 PM  Between 7am to 6pm - Pager - [phone]  After 6pm go to [email or web address] - Social research officer, governmentpassword EPAS [facility]  [facility] [place] [facility]  Office  [phone]  CC: Primary care physician; System, Pcp Not In  Note: This dictation was prepared with Dragon dictation along with smaller phrase technology. Any transcriptional errors that result from this process are unintentional.

## 2018-05-18 LAB — BASIC METABOLIC PANEL
ANION GAP: 8 (ref 5–15)
BUN: 19 mg/dL (ref 6–20)
CO2: 29 mmol/L (ref 22–32)
Calcium: 8.6 mg/dL — ABNORMAL LOW (ref 8.9–10.3)
Chloride: 103 mmol/L (ref 98–111)
Creatinine, Ser: 1.09 mg/dL — ABNORMAL HIGH (ref 0.44–1.00)
GFR calc Af Amer: >60 mL/min (ref >60)
GFR, EST NON AFRICAN AMERICAN: 56 mL/min — AB (ref >60)
Glucose, Bld: 101 mg/dL — ABNORMAL HIGH (ref 70–99)
Potassium: 3.2 mmol/L — ABNORMAL LOW (ref 3.5–5.1)
Sodium: 140 mmol/L (ref 135–145)

## 2018-05-18 LAB — CYCLIC CITRUL PEPTIDE ANTIBODY, IGG/IGA: CCP Antibodies IgG/IgA: 7 units (ref 0–19)

## 2018-05-18 LAB — RHEUMATOID FACTOR: RHEUMATOID FACTOR: 23.2 [IU]/mL — AB (ref 0.0–13.9)

## 2018-05-18 LAB — MAGNESIUM: MAGNESIUM: 2.2 mg/dL (ref 1.7–2.4)

## 2018-05-18 LAB — ANA COMPREHENSIVE PANEL
Centromere Ab Screen: <0.2 AI (ref 0.0–0.9)
Chromatin Ab SerPl-aCnc: <0.2 AI (ref 0.0–0.9)
ENA SM Ab Ser-aCnc: <0.2 AI (ref 0.0–0.9)
Ribonucleic Protein: <0.2 AI (ref 0.0–0.9)
SSA (Ro) (ENA) Antibody, IgG: <0.2 AI (ref 0.0–0.9)
SSB (La) (ENA) Antibody, IgG: <0.2 AI (ref 0.0–0.9)
Scleroderma (Scl-70) (ENA) Antibody, IgG: <0.2 AI (ref 0.0–0.9)
ds DNA Ab: 1 IU/mL (ref 0–9)

## 2018-05-18 LAB — CBC
HCT: 44.2 % (ref 36.0–46.0)
Hemoglobin: 14.8 g/dL (ref 12.0–15.0)
MCH: 31.4 pg (ref 26.0–34.0)
MCHC: 33.5 g/dL (ref 30.0–36.0)
MCV: 93.8 fL (ref 80.0–100.0)
Platelets: 200 10*3/uL (ref 150–400)
RBC: 4.71 MIL/uL (ref 3.87–5.11)
RDW: 11.8 % (ref 11.5–15.5)
WBC: 6 10*3/uL (ref 4.0–10.5)
nRBC: 0 % (ref 0.0–0.2)

## 2018-05-18 LAB — HIV ANTIBODY (ROUTINE TESTING W REFLEX): HIV Screen 4th Generation wRfx: NONREACTIVE

## 2018-05-18 LAB — ANGIOTENSIN CONVERTING ENZYME: Angiotensin-Converting Enzyme: 54 U/L (ref 14–82)

## 2018-05-18 MED ORDER — AZITHROMYCIN 250 MG PO TABS: 500.0000 mg | ORAL_TABLET | Freq: Every day | ORAL | 0 refills | Status: DC

## 2018-05-18 MED ORDER — GUAIFENESIN ER 600 MG PO TB12: 600.0000 mg | ORAL_TABLET | Freq: Two times a day (BID) | ORAL | 0 refills | Status: DC

## 2018-05-18 NOTE — Progress Notes (Addendum)
Pt. Discharged to home via personal vehicle. Discharge instructions and medication regimen reviewed at bedside with patient. Pt. verbalizes understanding of instructions and medication regimen. Patient assessment unchanged from this morning. IV discontinued per policy. RN added mucinex to allergy list and shredded the mucinex prescription. MD notified.

## 2018-05-18 NOTE — Discharge Summary (Signed)
[facility] - [place] at [facility]   PATIENT NAME: Penny SenateMarilyn Gallien    MR#:  [id number]  DATE OF BIRTH:  25-Nov-1960  DATE OF ADMISSION:  05/16/2018   ADMITTING PHYSICIAN: Enid Baasadhika Kalisetti, MD  DATE OF DISCHARGE: 05/18/2018  PRIMARY CARE PHYSICIAN: System, Pcp Not In   ADMISSION DIAGNOSIS:  Pleurodynia [R07.81] Community acquired pneumonia, unspecified laterality [J18.9] DISCHARGE DIAGNOSIS:  Active Problems:   Pleurisy  SECONDARY DIAGNOSIS:   Past Medical History:  Diagnosis Date  . COPD (chronic obstructive pulmonary disease) (HCC)   . Depression   . Hypertension    HOSPITAL COURSE:  Chief complaint; shortness of breath  HPI Penny SenateMarilyn Ahmad  is a 58 y.o. female with a known history of COPD not on home oxygen, hypertension and depression presents to hospital secondary to worsening left-sided chest pain on deep breath. Patient states that she was doing well up until last week.  She started noticing shortness of breath with minimal exertion which is unusual for her.  Occasional dry cough.  Denies any fevers but some chills.  No recent travel or exposure to anybody sick.  Since last night around 9 PM she started having significant left-sided pain on deep inspiration.  No known cardiac history.  Troponins are negative.  EKG without any acute findings.  CT angiogram of the chest showing some pleural-based nodular densities which could be nonspecific or related to acute infectious or inflammatory process.   HOSPITAL COURSE; 1. Left-sided pleurisy-had upper respiratory infection last week. CTA chest done was negative for pulmonary embolism but revealed poorly defined peripheral opacities in both lower lobes, right side greater than left. Findings could be related to infectious or inflammatory process of unknown age. Acute process can not be excluded. Based on the underlying emphysema.  Patient was treated empirically with antibiotics with azithromycin for  pneumonitis.  Clinically and hemodynamically stable.  I discussed case with pulmonologist on call today Dr. Lucianne LeiSA kHAN and discussed CT scan findings with him.  He agrees with having patient follow-up with him in pulmonary clinic for ongoing monitoring and possibly repeating CT scan of the chest in 3 months to ensure complete resolution of findings which is likely reactive -Smoking cessation advised. C-reactive protein of 7.1.  Erythrocyte sedimentation rate of 11.  Clinically and hemodynamically stable for discharge.  2. Hypertension-stable. Continue home medications.  3. Depression-patient on Effexor.resumed home regimen Was changed from observation to inpatient.  DISCHARGE CONDITIONS:  Stable CONSULTS OBTAINED:   DRUG ALLERGIES:  No Known Allergies DISCHARGE MEDICATIONS:   Allergies as of 05/18/2018   No Known Allergies     Medication List    TAKE these medications   albuterol (2.5 MG/3ML) 0.083% nebulizer solution Commonly known as:  PROVENTIL Take 2.5 mg by nebulization every 4 (four) hours as needed for shortness of breath or cough.   azithromycin 250 MG tablet Commonly known as:  ZITHROMAX Take 2 tablets (500 mg total) by mouth daily.   cetirizine 10 MG tablet Commonly known as:  ZYRTEC Take 10 mg by mouth daily.   chlorthalidone 25 MG tablet Commonly known as:  HYGROTON Take 25 mg by mouth every morning.   Fluticasone-Salmeterol 500-50 MCG/DOSE Aepb Commonly known as:  ADVAIR Inhale 1 puff into the lungs 2 (two) times daily.   gabapentin 300 MG capsule Commonly known as:  NEURONTIN Take 300 mg by mouth daily.   guaiFENesin 600 MG 12 hr tablet Commonly known as:  MUCINEX Take 1 tablet (600 mg total) by mouth  2 (two) times daily.   ipratropium 0.02 % nebulizer solution Commonly known as:  ATROVENT Inhale 2.5 mLs into the lungs 4 (four) times daily.   Naproxen Sodium 220 MG Caps Take 440 mg by mouth 2 (two) times daily.   potassium chloride SA 20 MEQ  tablet Commonly known as:  K-DUR,KLOR-CON Take 40 mEq by mouth daily.   venlafaxine 75 MG tablet Commonly known as:  EFFEXOR Take 75 mg by mouth daily.   venlafaxine XR 150 MG 24 hr capsule Commonly known as:  EFFEXOR-XR Take 150 mg by mouth daily.        DISCHARGE INSTRUCTIONS:   DIET:  Cardiac diet DISCHARGE CONDITION:  Stable ACTIVITY:  Activity as tolerated OXYGEN:  Home Oxygen: No.  Oxygen Delivery: room air DISCHARGE LOCATION:  home   If you experience worsening of your admission symptoms, develop shortness of breath, life threatening emergency, suicidal or homicidal thoughts you must seek medical attention immediately by calling 911 or calling your MD immediately  if symptoms less severe.  You Must read complete instructions/literature along with all the possible adverse reactions/side effects for all the Medicines you take and that have been prescribed to you. Take any new Medicines after you have completely understood and accpet all the possible adverse reactions/side effects.   Please note  You were cared for by a hospitalist during your hospital stay. If you have any questions about your discharge medications or the care you received while you were in the hospital after you are discharged, you can call the unit and asked to speak with the hospitalist on call if the hospitalist that took care of you is not available. Once you are discharged, your primary care physician will handle any further medical issues. Please note that NO REFILLS for any discharge medications will be authorized once you are discharged, as it is imperative that you return to your primary care physician (or establish a relationship with a primary care physician if you do not have one) for your aftercare needs so that they can reassess your need for medications and monitor your lab values.    On the day of Discharge:  VITAL SIGNS:  Blood pressure 124/78, pulse 76, temperature 98.5 F (36.9 C),  resp. rate 18, height 5\' 3"  (1.6 m), weight 86.6 kg, SpO2 100 %. PHYSICAL EXAMINATION:  GENERAL:  58 y.o.-year-old patient lying in the bed with no acute distress.  EYES: Pupils equal, round, reactive to light and accommodation. No scleral icterus. Extraocular muscles intact.  HEENT: Head atraumatic, normocephalic. Oropharynx and nasopharynx clear.  NECK:  Supple, no jugular venous distention. No thyroid enlargement, no tenderness.  LUNGS: Normal breath sounds bilaterally, no wheezing, rales,rhonchi or crepitation. No use of accessory muscles of respiration.  CARDIOVASCULAR: S1, S2 normal. No murmurs, rubs, or gallops.  ABDOMEN: Soft, non-tender, non-distended. Bowel sounds present. No organomegaly or mass.  EXTREMITIES: No pedal edema, cyanosis, or clubbing.  NEUROLOGIC: Cranial nerves II through XII are intact. Muscle strength 5/5 in all extremities. Sensation intact. Gait not checked.  PSYCHIATRIC: The patient is alert and oriented x 3.  SKIN: No obvious rash, lesion, or ulcer.  DATA REVIEW:   CBC Recent Labs  Lab 05/18/18 0330  WBC 6.0  HGB 14.8  HCT 44.2  PLT 200    Chemistries  Recent Labs  Lab 05/16/18 0842  05/18/18 0330  NA 137   < > 140  K 3.4*   < > 3.2*  CL 99   < > 103  CO2 29   < > 29  GLUCOSE 104*   < > 101*  BUN 11   < > 19  CREATININE 1.03*   < > 1.09*  CALCIUM 9.1   < > 8.6*  MG  --   --  2.2  AST 18  --   --   ALT 18  --   --   ALKPHOS 57  --   --   BILITOT 0.8  --   --    < > = values in this interval not displayed.     Microbiology Results  Results for orders placed or performed during the hospital encounter of 05/16/18  Culture, blood (routine x 2)     Status: None (Preliminary result)   Collection Time: 05/16/18 12:04 PM  Result Value Ref Range Status   Specimen Description BLOOD BLOOD LEFT FOREARM  Final   Special Requests   Final    BOTTLES DRAWN AEROBIC AND ANAEROBIC Blood Culture results may not be optimal due to an excessive volume of  blood received in culture bottles   Culture   Final    NO GROWTH 2 DAYS Performed at [facility], [address], [place], [place] [address]    Report Status PENDING  Incomplete  Culture, blood (routine x 2)     Status: None (Preliminary result)   Collection Time: 05/16/18 12:04 PM  Result Value Ref Range Status   Specimen Description BLOOD BLOOD LEFT HAND  Final   Special Requests   Final    BOTTLES DRAWN AEROBIC AND ANAEROBIC Blood Culture adequate volume   Culture   Final    NO GROWTH 2 DAYS Performed at [facility], [address] [place], [place] [address]    Report Status PENDING  Incomplete    RADIOLOGY:  No results found.   Management plans discussed with the patient, family and they are in agreement.  CODE STATUS: Full Code   TOTAL TIME TAKING CARE OF THIS PATIENT: 33 minutes.    Jisselle Poth M.D on 05/18/2018 at 10:21 AM  Between 7am to 6pm - Pager - [phone]  After 6pm go to [email or web address] - Social research officer, government  [facility] [place] [facility]  Office  [phone]  CC: Primary care physician; System, Pcp Not In   Note: This dictation was prepared with Dragon dictation along with smaller phrase technology. Any transcriptional errors that result from this process are unintentional.

## 2018-05-21 LAB — CULTURE, BLOOD (ROUTINE X 2)
Culture: NO GROWTH
Culture: NO GROWTH
Special Requests: ADEQUATE

## 2018-05-23 ENCOUNTER — Telehealth: Payer: Self-pay

## 2018-05-23 NOTE — Telephone Encounter (Signed)
EMMI follow-up: Noted on the report that the patient did not know who to contact if there was a change in her condition and didn't have a follow-up appointment.  I talked with Penny Dickson and she was aware of her Jan. 30 appointment with Penny Dickson and she had called her PCP but that MD said to wait and schedule an appointment after she saw pulmonary MD. Reminded her to reach out to PCP or Penny Dickson if she saw a change in her condition and she said she would.  I let her know there would be a second automated call with a different series of questions and to let us know if she had any concerns at that time.

## 2018-05-24 ENCOUNTER — Telehealth: Payer: Self-pay | Admitting: Licensed Clinical Social Worker

## 2018-05-24 NOTE — Telephone Encounter (Signed)
EMMI flagged patient for answering yes to feeling sad/hopeless/anxious/empty. Clinical Child psychotherapist (CSW) contacted patient via telephone. Patient scored 0 on PHQ-9. Patient reported that she is doing fine and has no needs. Per patient she is not depressed and is not having thoughts of hurting herself. Per patient she did not mean to answer yes to feeling sad. Patient reported no needs or concerns.   [employer], LCSW [phone]

## 2018-06-02 ENCOUNTER — Ambulatory Visit (INDEPENDENT_AMBULATORY_CARE_PROVIDER_SITE_OTHER): Payer: Medicare Other | Admitting: Internal Medicine

## 2018-06-02 ENCOUNTER — Encounter: Payer: Self-pay | Admitting: Internal Medicine

## 2018-06-02 VITALS — BP 120/82 | HR 54 | Resp 16 | Ht 63.0 in | Wt 196.0 lb

## 2018-06-02 DIAGNOSIS — R0789 Other chest pain: Secondary | ICD-10-CM | POA: Diagnosis not present

## 2018-06-02 DIAGNOSIS — G4710 Hypersomnia, unspecified: Secondary | ICD-10-CM | POA: Diagnosis not present

## 2018-06-02 DIAGNOSIS — R0602 Shortness of breath: Secondary | ICD-10-CM | POA: Diagnosis not present

## 2018-06-02 DIAGNOSIS — R0683 Snoring: Secondary | ICD-10-CM | POA: Diagnosis not present

## 2018-06-02 DIAGNOSIS — I509 Heart failure, unspecified: Secondary | ICD-10-CM

## 2018-06-02 DIAGNOSIS — R918 Other nonspecific abnormal finding of lung field: Secondary | ICD-10-CM

## 2018-06-02 DIAGNOSIS — F17290 Nicotine dependence, other tobacco product, uncomplicated: Secondary | ICD-10-CM

## 2018-06-02 NOTE — Patient Instructions (Signed)

## 2018-06-02 NOTE — Progress Notes (Signed)
[facility] [address] [place], [place] [address]  Pulmonary Sleep Medicine   Office Visit Note  Patient Name: Penny Dickson DOB: 10-05-1958 MRN [id number]  Date of Service: 06/02/2018  Complaints/HPI: SOB, Pulmonary Nodules, Cough, Smoker atypical chest pain, CHF by history, Hypersomnia napping patient is referred from the hospital.  She has multiple complaints including shortness of breath.  She does admit to smoking.  She is not sure if she has a history of COPD I do not believe that she has been formally diagnosed.  Patient was in the hospital with a diagnosis of pneumonia had a CT scan of the chest on for a CT angiography for pulmonary embolism which was unremarkable however she was noted to have some pulmonary nodularity.  I did review the CT scan with her she has some small non-discrete nodules noted and a follow-up is definitely warranted.  As far as her cough is concerned she does continue to have some cough she has already mentioned does have is history of smoking and continues to do so.  Patient also complained about having atypical pain in her chest this was the main reason she had the CT angiogram done in the first place.  She states that she has not had a cardiology evaluation.  Because of the shortness of breath that I think that needs to be looked at further.  Also she does have a history of congestive heart failure  ROS  General: (-) fever, (-) chills, (-) night sweats, (-) weakness Skin: (-) rashes, (-) itching,. Eyes: (-) visual changes, (-) redness, (-) itching. Nose and Sinuses: (-) nasal stuffiness or itchiness, (-) postnasal drip, (-) nosebleeds, (-) sinus trouble. Mouth and Throat: (-) sore throat, (-) hoarseness. Neck: (-) swollen glands, (-) enlarged thyroid, (-) neck pain. Respiratory: + cough, (-) bloody sputum, + shortness of breath, - wheezing. Cardiovascular: - ankle swelling, (-) chest pain. Lymphatic: (-) lymph node enlargement. Neurologic:  (-) numbness, (-) tingling. Psychiatric: (-) anxiety, (-) depression   Current Medication: Outpatient Encounter Medications as of 06/02/2018  Medication Sig  . albuterol (PROVENTIL) (2.5 MG/3ML) 0.083% nebulizer solution Take 2.5 mg by nebulization every 4 (four) hours as needed for shortness of breath or cough.  . cetirizine (ZYRTEC) 10 MG tablet Take 10 mg by mouth daily.  . chlorthalidone (HYGROTON) 25 MG tablet Take 25 mg by mouth every morning.  . Fluticasone-Salmeterol (ADVAIR) 500-50 MCG/DOSE AEPB Inhale 1 puff into the lungs 2 (two) times daily.  Marland Kitchen gabapentin (NEURONTIN) 300 MG capsule Take 300 mg by mouth daily.  . Naproxen Sodium 220 MG CAPS Take 440 mg by mouth 2 (two) times daily.  . potassium chloride SA (K-DUR,KLOR-CON) 20 MEQ tablet Take 40 mEq by mouth daily.   Marland Kitchen venlafaxine (EFFEXOR) 75 MG tablet Take 75 mg by mouth daily.  Marland Kitchen venlafaxine XR (EFFEXOR-XR) 150 MG 24 hr capsule Take 150 mg by mouth daily.  . [DISCONTINUED] azithromycin (ZITHROMAX) 250 MG tablet Take 2 tablets (500 mg total) by mouth daily. (Patient not taking: Reported on 06/02/2018)  . [DISCONTINUED] guaiFENesin (MUCINEX) 600 MG 12 hr tablet Take 1 tablet (600 mg total) by mouth 2 (two) times daily. (Patient not taking: Reported on 06/02/2018)  . [DISCONTINUED] ipratropium (ATROVENT) 0.02 % nebulizer solution Inhale 2.5 mLs into the lungs 4 (four) times daily.   No facility-administered encounter medications on file as of 06/02/2018.     Surgical History: No past surgical history on file.  Medical History: Past Medical History:  Diagnosis Date  .  COPD (chronic obstructive pulmonary disease) (HCC)   . COPD (chronic obstructive pulmonary disease) (HCC)   . Depression   . Emphysema lung (HCC)   . Heart disease   . Hypertension     Family History: Family History  Problem Relation Age of Onset  . COPD Mother   . Heart failure Mother   . CAD Father     Social History: Social History   Socioeconomic  History  . Marital status: Single    Spouse name: Not on file  . Number of children: Not on file  . Years of education: Not on file  . Highest education level: Not on file  Occupational History  . Not on file  Social Needs  . Financial resource strain: Not on file  . Food insecurity:    Worry: Not on file    Inability: Not on file  . Transportation needs:    Medical: Not on file    Non-medical: Not on file  Tobacco Use  . Smoking status: Current Every Day Smoker    Types: Cigars  . Smokeless tobacco: Never Used  Substance and Sexual Activity  . Alcohol use: Never    Frequency: Never  . Drug use: Never  . Sexual activity: Not on file  Lifestyle  . Physical activity:    Days per week: Not on file    Minutes per session: Not on file  . Stress: Not on file  Relationships  . Social connections:    Talks on phone: Not on file    Gets together: Not on file    Attends religious service: Not on file    Active member of club or organization: Not on file    Attends meetings of clubs or organizations: Not on file    Relationship status: Not on file  . Intimate partner violence:    Fear of current or ex partner: Not on file    Emotionally abused: Not on file    Physically abused: Not on file    Forced sexual activity: Not on file  Other Topics Concern  . Not on file  Social History Narrative   With mother, independent at baseline    Vital Signs: Blood pressure 120/82, pulse (!) 54, resp. rate 16, height 5\' 3"  (1.6 m), weight 196 lb (88.9 kg), SpO2 97 %.  Examination: General Appearance: The patient is well-developed, well-nourished, and in no distress. Skin: Gross inspection of skin unremarkable. Head: normocephalic, no gross deformities. Eyes: no gross deformities noted. ENT: ears appear grossly normal no exudates. Neck: Supple. No thyromegaly. No LAD. Respiratory: few rhonchi noted. Cardiovascular: Normal S1 and S2 without murmur or rub. Extremities: No cyanosis.  pulses are equal. Neurologic: Alert and oriented. No involuntary movements.  LABS: Recent Results (from the past 2160 hour(s))  CBC with Differential     Status: Abnormal   Collection Time: 05/16/18  8:42 AM  Result Value Ref Range   WBC 7.7 4.0 - 10.5 K/uL   RBC 5.06 3.87 - 5.11 MIL/uL   Hemoglobin 16.0 (H) 12.0 - 15.0 g/dL   HCT 16.1 (H) 09.6 - 04.5 %   MCV 92.3 80.0 - 100.0 fL   MCH 31.6 26.0 - 34.0 pg   MCHC 34.3 30.0 - 36.0 g/dL   RDW 40.9 81.1 - 91.4 %   Platelets 212 150 - 400 K/uL   nRBC 0.0 0.0 - 0.2 %   Neutrophils Relative % 68 %   Neutro Abs 5.1 1.7 - 7.7 K/uL  Lymphocytes Relative 24 %   Lymphs Abs 1.9 0.7 - 4.0 K/uL   Monocytes Relative 7 %   Monocytes Absolute 0.6 0.1 - 1.0 K/uL   Eosinophils Relative 1 %   Eosinophils Absolute 0.1 0.0 - 0.5 K/uL   Basophils Relative 0 %   Basophils Absolute 0.0 0.0 - 0.1 K/uL   Immature Granulocytes 0 %   Abs Immature Granulocytes 0.02 0.00 - 0.07 K/uL    Comment: Performed at [facility], [address] [place], [place] [address]  Comprehensive metabolic panel     Status: Abnormal   Collection Time: 05/16/18  8:42 AM  Result Value Ref Range   Sodium 137 135 - 145 mmol/L   Potassium 3.4 (L) 3.5 - 5.1 mmol/L   Chloride 99 98 - 111 mmol/L   CO2 29 22 - 32 mmol/L   Glucose, Bld 104 (H) 70 - 99 mg/dL   BUN 11 6 - 20 mg/dL   Creatinine, Ser 6.041.03 (H) 0.44 - 1.00 mg/dL   Calcium 9.1 8.9 - 54.010.3 mg/dL   Total Protein 7.1 6.5 - 8.1 g/dL   Albumin 4.0 3.5 - 5.0 g/dL   AST 18 15 - 41 U/L   ALT 18 0 - 44 U/L   Alkaline Phosphatase 57 38 - 126 U/L   Total Bilirubin 0.8 0.3 - 1.2 mg/dL   GFR calc non Af Amer 60 (L) >60 mL/min   GFR calc Af Amer >60 >60 mL/min   Anion gap 9 5 - 15    Comment: Performed at [facility], [address] [place], [place] [address]  Fibrin derivatives D-Dimer     Status: None   Collection Time: 05/16/18  8:42 AM  Result Value Ref Range   Fibrin derivatives D-dimer ([facility])  440.18 0.00 - 499.00 ng/mL (FEU)    Comment: (NOTE) <> Exclusion of Venous Thromboembolism (VTE) - OUTPATIENT ONLY   (Emergency Department or [place])   0-499 ng/ml (FEU): With a low to intermediate pretest probability                      for VTE this test result excludes the diagnosis                      of VTE.   >499 ng/ml (FEU) : VTE not excluded; additional work up for VTE is                      required. <> Testing on Inpatients and Evaluation of Disseminated Intravascular   Coagulation (DIC) Reference Range:   0-499 ng/ml (FEU) Performed at [facility], [address] [place], [place] [address]   Troponin I - Once     Status: None   Collection Time: 05/16/18  8:42 AM  Result Value Ref Range   Troponin I <0.03 <0.03 ng/mL    Comment: Performed at [facility], [address] [place], [place] [address]  Culture, blood (routine x 2)     Status: None   Collection Time: 05/16/18 12:04 PM  Result Value Ref Range   Specimen Description BLOOD BLOOD LEFT FOREARM    Special Requests      BOTTLES DRAWN AEROBIC AND ANAEROBIC Blood Culture results may not be optimal due to an excessive volume of blood received in culture bottles   Culture      NO GROWTH 5 DAYS Performed at [facility], [address]  [place], [place] [address]    Report Status 05/21/2018 FINAL   Culture, blood (routine x 2)     Status: None   Collection Time: 05/16/18 12:04 PM  Result Value Ref Range   Specimen Description BLOOD BLOOD LEFT HAND    Special Requests      BOTTLES DRAWN AEROBIC AND ANAEROBIC Blood Culture adequate volume   Culture      NO GROWTH 5 DAYS Performed at [facility], [address] [place], [place] [address]    Report Status 05/21/2018 FINAL   ANA Comprehensive Panel     Status: None   Collection Time: 05/16/18  9:45 PM  Result Value Ref Range   ds DNA Ab 1 0 - 9 IU/mL    Comment: (NOTE)                                   Negative       <5                                   Equivocal  5 - 9                                   Positive      >9    Ribonucleic Protein <0.2 0.0 - 0.9 AI   ENA SM Ab Ser-aCnc <0.2 0.0 - 0.9 AI   Scleroderma (Scl-70) (ENA) Antibody, IgG <0.2 0.0 - 0.9 AI   SSA (Ro) (ENA) Antibody, IgG <0.2 0.0 - 0.9 AI   SSB (La) (ENA) Antibody, IgG <0.2 0.0 - 0.9 AI   Chromatin Ab SerPl-aCnc <0.2 0.0 - 0.9 AI   Anti JO-1 <0.2 0.0 - 0.9 AI   Centromere Ab Screen <0.2 0.0 - 0.9 AI   See below: Comment     Comment: (NOTE) Autoantibody                       Disease Association ------------------------------------------------------------                        Condition                  Frequency ---------------------   ------------------------   --------- Antinuclear Antibody,    SLE, mixed connective Direct (ANA-D)           tissue diseases ---------------------   ------------------------   --------- dsDNA                    SLE                        40 - 60% ---------------------   ------------------------   --------- Chromatin                Drug induced SLE                90%                         SLE                        48 - 97% ---------------------   ------------------------   ---------  SSA (Ro)                 SLE                        25 - 35%                         Sjogren's Syndrome         40 - 70%                         Neonatal Lupus                 100% ---------------------   ------------------------   --------- SSB (La)                 SLE                              10%                         Sjogren's Syndrome              30% ---------------------   -----------------------    --------- Sm (anti-Smith)          SLE                        15 - 30% ---------------------   -----------------------    --------- RNP                      Mixed Connective Tissue                         Disease                         95% (U1 nRNP,                SLE                        30 -  50% anti-ribonucleoprotein)  Polymyositis and/or                         Dermatomyositis                 20% ---------------------   ------------------------   --------- Scl-70 (antiDNA          Scleroderma (diffuse)      20 - 35% topoisomerase)           Crest                           13% ---------------------   ------------------------   --------- Jo-1                     Polymyositis and/or                         Dermatomyositis            20 - 40% ---------------------   ------------------------   --------- Centromere B             Scleroderma -  Crest                         variant                         80% Performed At: [facility] [address] [place], [place] [address] Jolene Schimke MD [id number]   Sedimentation rate     Status: None   Collection Time: 05/16/18  9:45 PM  Result Value Ref Range   Sed Rate 11 0 - 30 mm/hr    Comment: Performed at [facility], [address] [place], [place] [address]  C-reactive protein     Status: Abnormal   Collection Time: 05/16/18  9:45 PM  Result Value Ref Range   CRP 7.1 (H) <1.0 mg/dL    Comment: Performed at [facility], [address], [place], [place] [address]  Angiotensin converting enzyme     Status: None   Collection Time: 05/16/18  9:45 PM  Result Value Ref Range   Angiotensin-Converting Enzyme 54 14 - 82 U/L    Comment: (NOTE) Performed At: [facility] [address] [place], [place] [address] Jolene Schimke MD [id number]   Rheumatoid factor     Status: Abnormal   Collection Time: 05/16/18  9:45 PM  Result Value Ref Range   Rhuematoid fact SerPl-aCnc 23.2 (H) 0.0 - 13.9 IU/mL    Comment: (NOTE) Performed At: [facility] [address] [place], [place] [id number] Jolene Schimke MD [id number]   CYCLIC CITRUL PEPTIDE ANTIBODY, IGG/IGA     Status: None   Collection Time: 05/16/18  9:45 PM  Result Value Ref Range   CCP Antibodies IgG/IgA 7 0 - 19 units     Comment: (NOTE)                          Negative               <20                          Weak positive      20 - 39                          Moderate positive  40 - 59                          Strong positive        >59 Performed At: [facility] [address] [place], [place] [id number] Jolene Schimke MD YS:[id number]   HIV antibody (Routine Testing)     Status: None   Collection Time: 05/16/18  9:45 PM  Result Value Ref Range   HIV Screen 4th Generation wRfx Non Reactive Non Reactive    Comment: (NOTE) Performed At: [facility] [address] [place], [place] [id number] Jolene Schimke MD [id number]   Troponin I - Now Then Q6H     Status: None   Collection Time: 05/16/18  9:45 PM  Result Value Ref Range   Troponin I <0.03 <0.03 ng/mL    Comment: Performed at [facility], [address], [place], [place] [address]  Basic metabolic panel     Status: Abnormal   Collection Time: 05/17/18  2:39 AM  Result Value Ref Range   Sodium 138 135 -  145 mmol/L   Potassium 3.5 3.5 - 5.1 mmol/L   Chloride 102 98 - 111 mmol/L   CO2 26 22 - 32 mmol/L   Glucose, Bld 225 (H) 70 - 99 mg/dL   BUN 13 6 - 20 mg/dL   Creatinine, Ser 8.29 (H) 0.44 - 1.00 mg/dL   Calcium 9.3 8.9 - 56.2 mg/dL   GFR calc non Af Amer >60 >60 mL/min   GFR calc Af Amer >60 >60 mL/min   Anion gap 10 5 - 15    Comment: Performed at [facility], [address] [place], [place] [address]  CBC     Status: Abnormal   Collection Time: 05/17/18  2:39 AM  Result Value Ref Range   WBC 4.5 4.0 - 10.5 K/uL   RBC 4.94 3.87 - 5.11 MIL/uL   Hemoglobin 15.5 (H) 12.0 - 15.0 g/dL   HCT 57.8 46.9 - 62.9 %   MCV 92.7 80.0 - 100.0 fL   MCH 31.4 26.0 - 34.0 pg   MCHC 33.8 30.0 - 36.0 g/dL   RDW 52.8 41.3 - 24.4 %   Platelets 215 150 - 400 K/uL   nRBC 0.0 0.0 - 0.2 %    Comment: Performed at [facility], [address] [place], [place] [address]  Troponin I - Now Then Q6H      Status: None   Collection Time: 05/17/18  2:39 AM  Result Value Ref Range   Troponin I <0.03 <0.03 ng/mL    Comment: Performed at [facility], [address] [place], [place] [address]  Troponin I - Now Then Q6H     Status: None   Collection Time: 05/17/18  8:42 AM  Result Value Ref Range   Troponin I <0.03 <0.03 ng/mL    Comment: Performed at [facility], [address] [place], [place] [address]  Influenza panel by PCR (type A & B)     Status: None   Collection Time: 05/17/18  2:05 PM  Result Value Ref Range   Influenza A By PCR NEGATIVE NEGATIVE   Influenza B By PCR NEGATIVE NEGATIVE    Comment: (NOTE) The Xpert Xpress Flu assay is intended as an aid in the diagnosis of  influenza and should not be used as a sole basis for treatment.  This  assay is FDA approved for nasopharyngeal swab specimens only. Nasal  washings and aspirates are unacceptable for Xpert Xpress Flu testing. Performed at [facility], [address] [place], [place] [address]   Basic metabolic panel     Status: Abnormal   Collection Time: 05/18/18  3:30 AM  Result Value Ref Range   Sodium 140 135 - 145 mmol/L   Potassium 3.2 (L) 3.5 - 5.1 mmol/L   Chloride 103 98 - 111 mmol/L   CO2 29 22 - 32 mmol/L   Glucose, Bld 101 (H) 70 - 99 mg/dL   BUN 19 6 - 20 mg/dL   Creatinine, Ser 5.63 (H) 0.44 - 1.00 mg/dL   Calcium 8.6 (L) 8.9 - 10.3 mg/dL   GFR calc non Af Amer 56 (L) >60 mL/min   GFR calc Af Amer >60 >60 mL/min   Anion gap 8 5 - 15    Comment: Performed at [facility], [address] [place], [place] [address]  CBC     Status: None   Collection Time: 05/18/18  3:30 AM  Result Value Ref Range   WBC 6.0 4.0 - 10.5  K/uL   RBC 4.71 3.87 - 5.11 MIL/uL   Hemoglobin 14.8 12.0 - 15.0 g/dL   HCT 08.644.2 57.836.0 - 46.946.0 %   MCV 93.8 80.0 - 100.0 fL   MCH 31.4 26.0 - 34.0 pg   MCHC 33.5 30.0 - 36.0 g/dL   RDW 62.911.8 52.811.5 - 41.315.5 %   Platelets 200 150 - 400 K/uL   nRBC 0.0  0.0 - 0.2 %    Comment: Performed at [facility], [address] [place], [place] [address]  Magnesium     Status: None   Collection Time: 05/18/18  3:30 AM  Result Value Ref Range   Magnesium 2.2 1.7 - 2.4 mg/dL    Comment: Performed at [facility], [address] [place], [place] [address]    Radiology: Dg Chest 2 View  Result Date: 05/16/2018 CLINICAL DATA:  Left chest pain, shortness of breath EXAM: CHEST - 2 VIEW COMPARISON:  None. FINDINGS: Lungs are clear.  No pleural effusion or pneumothorax. The heart is normal in size. Visualized osseous structures are within normal limits. IMPRESSION: Normal chest radiographs. Electronically Signed   By: Charline BillsSriyesh  Krishnan M.D.   On: 05/16/2018 00:20   Ct Angio Chest Pe W And/or Wo Contrast  Result Date: 05/16/2018 CLINICAL DATA:  58 year old with left chest pain. Shortness of breath. EXAM: CT ANGIOGRAPHY CHEST WITH CONTRAST TECHNIQUE: Multidetector CT imaging of the chest was performed using the standard protocol during bolus administration of intravenous contrast. Multiplanar CT image reconstructions and MIPs were obtained to evaluate the vascular anatomy. CONTRAST:  75mL OMNIPAQUE IOHEXOL 350 MG/ML SOLN COMPARISON:  None. FINDINGS: Cardiovascular: Satisfactory opacification of the pulmonary arteries to the segmental level. No evidence of pulmonary embolism. Normal heart size. No pericardial effusion. Normal caliber of the thoracic aorta. Atherosclerotic calcifications at the aortic arch and origin of the great vessels. Mediastinum/Nodes: No significant mediastinal or hilar lymphadenopathy. Left hilar tissue measures roughly 0.9 cm in the short axis on sequence 5, image 127. No axillary lymph node enlargement. Esophagus is unremarkable. Lungs/Pleura: Trachea and mainstem bronchi are patent. Peripheral densities in the posterior right lower lobe best seen on sequence 6, image 64. Evidence for mild centrilobular emphysema in the  upper lungs. 4 mm peripheral nodule in the right upper lobe on sequence 6 image 16. Peripheral irregular densities in the posterior left lower lobe near the superior segment, best seen on sequence 6, image 55. Focal thickening along the left major fissure on sequence 6, image 53 probably represents a subpleural lymph node. Subtle density in the right upper lobe near the right major fissure on sequence 6, image 33 is nonspecific. Upper Abdomen: No acute abnormality in the upper abdomen. Musculoskeletal: No acute bone abnormality. Review of the MIP images confirms the above findings. IMPRESSION: 1. Negative for a pulmonary embolism. 2. Poorly defined peripheral opacities in both lower lobes, right side greater than left. Findings could be related to infectious or inflammatory process of unknown age. Acute process can not be excluded. Based on the underlying emphysema, recommend 3 month follow-up chest CT to ensure resolution of these densities. 3. Few scattered peripheral and pleural-based nodular densities, largest measuring roughly 4 mm. These small nodular densities are nonspecific. No follow-up needed if patient is low-risk (and has no known or suspected primary neoplasm). Non-contrast chest CT can be considered in 12 months if patient is high-risk. This recommendation follows the consensus statement: Guidelines for Management of Incidental Pulmonary Nodules Detected on CT Images: From the Fleischner  Society 2017; Radiology 2017; 284:228-243. 4. Aortic Atherosclerosis (ICD10-I70.0) and Emphysema (ICD10-J43.9). Electronically Signed   By: Richarda Overlie M.D.   On: 05/16/2018 09:52    No results found.  Dg Chest 2 View  Result Date: 05/16/2018 CLINICAL DATA:  Left chest pain, shortness of breath EXAM: CHEST - 2 VIEW COMPARISON:  None. FINDINGS: Lungs are clear.  No pleural effusion or pneumothorax. The heart is normal in size. Visualized osseous structures are within normal limits. IMPRESSION: Normal chest  radiographs. Electronically Signed   By: Charline Bills M.D.   On: 05/16/2018 00:20   Ct Angio Chest Pe W And/or Wo Contrast  Result Date: 05/16/2018 CLINICAL DATA:  58 year old with left chest pain. Shortness of breath. EXAM: CT ANGIOGRAPHY CHEST WITH CONTRAST TECHNIQUE: Multidetector CT imaging of the chest was performed using the standard protocol during bolus administration of intravenous contrast. Multiplanar CT image reconstructions and MIPs were obtained to evaluate the vascular anatomy. CONTRAST:  75mL OMNIPAQUE IOHEXOL 350 MG/ML SOLN COMPARISON:  None. FINDINGS: Cardiovascular: Satisfactory opacification of the pulmonary arteries to the segmental level. No evidence of pulmonary embolism. Normal heart size. No pericardial effusion. Normal caliber of the thoracic aorta. Atherosclerotic calcifications at the aortic arch and origin of the great vessels. Mediastinum/Nodes: No significant mediastinal or hilar lymphadenopathy. Left hilar tissue measures roughly 0.9 cm in the short axis on sequence 5, image 127. No axillary lymph node enlargement. Esophagus is unremarkable. Lungs/Pleura: Trachea and mainstem bronchi are patent. Peripheral densities in the posterior right lower lobe best seen on sequence 6, image 64. Evidence for mild centrilobular emphysema in the upper lungs. 4 mm peripheral nodule in the right upper lobe on sequence 6 image 16. Peripheral irregular densities in the posterior left lower lobe near the superior segment, best seen on sequence 6, image 55. Focal thickening along the left major fissure on sequence 6, image 53 probably represents a subpleural lymph node. Subtle density in the right upper lobe near the right major fissure on sequence 6, image 33 is nonspecific. Upper Abdomen: No acute abnormality in the upper abdomen. Musculoskeletal: No acute bone abnormality. Review of the MIP images confirms the above findings. IMPRESSION: 1. Negative for a pulmonary embolism. 2. Poorly defined  peripheral opacities in both lower lobes, right side greater than left. Findings could be related to infectious or inflammatory process of unknown age. Acute process can not be excluded. Based on the underlying emphysema, recommend 3 month follow-up chest CT to ensure resolution of these densities. 3. Few scattered peripheral and pleural-based nodular densities, largest measuring roughly 4 mm. These small nodular densities are nonspecific. No follow-up needed if patient is low-risk (and has no known or suspected primary neoplasm). Non-contrast chest CT can be considered in 12 months if patient is high-risk. This recommendation follows the consensus statement: Guidelines for Management of Incidental Pulmonary Nodules Detected on CT Images: From the Fleischner Society 2017; Radiology 2017; 284:228-243. 4. Aortic Atherosclerosis (ICD10-I70.0) and Emphysema (ICD10-J43.9). Electronically Signed   By: Richarda Overlie M.D.   On: 05/16/2018 09:52      Assessment and Plan: Patient Active Problem List   Diagnosis Date Noted  . Pleurisy 05/16/2018    1. SOB multifactorial probably related to COPD as well as congestive heart failure.  I recommended that we go ahead and get pulmonary function evaluation done on her as well as an echocardiogram to assess cardiac function.  She states that she has a history of congestive heart failure not sure if it systolic or  diastolic at this point 2. Chest Pain pain is atypical probably needs to follow-up with cardiology also has noted echocardiogram will be done 3. Hypersomnia snoring she also did complain to me about this snoring as well as being excessively sleepy.  She states that she does not wake up fraction feels tired during the daytime.  She can fall asleep while watching TV also.  I recommended that we get a sleep study done on her 4. Pulmonary nodules the nodules appear really non-discrete and they are located peripherally and it may very well have been related to  compression from being in the supine position I would recommend getting a follow-up CT scan in about 3 to 6 months 5. CHF echocardiogram was ordered we will continue to monitor fluid status discussed at she probably needs to be with a cardiologist for follow-up 6. Smoker patient continues to smoke smoking cessation was discussed at length with her  General Counseling: I have discussed the findings of the evaluation and examination with Jola Babinski.  I have also discussed any further diagnostic evaluation thatmay be needed or ordered today. Lamyra verbalizes understanding of the findings of todays visit. We also reviewed her medications today and discussed drug interactions and side effects including but not limited excessive drowsiness and altered mental states. We also discussed that there is always a risk not just to her but also people around her. she has been encouraged to call the office with any questions or concerns that should arise related to todays visit.    Time spent: 45 minutes  I have personally obtained a history, examined the patient, evaluated laboratory and imaging results, formulated the assessment and plan and placed orders.    Yevonne Pax, MD [facility] Pulmonary and Critical [facility] Sleep medicine

## 2018-06-07 ENCOUNTER — Other Ambulatory Visit (INDEPENDENT_AMBULATORY_CARE_PROVIDER_SITE_OTHER): Payer: Medicare Other | Admitting: Internal Medicine

## 2018-06-07 DIAGNOSIS — G4733 Obstructive sleep apnea (adult) (pediatric): Secondary | ICD-10-CM | POA: Diagnosis not present

## 2018-06-17 ENCOUNTER — Ambulatory Visit: Payer: Medicare Other

## 2018-06-17 DIAGNOSIS — R0602 Shortness of breath: Secondary | ICD-10-CM

## 2018-06-22 ENCOUNTER — Ambulatory Visit (INDEPENDENT_AMBULATORY_CARE_PROVIDER_SITE_OTHER): Payer: Medicare Other | Admitting: Internal Medicine

## 2018-06-22 DIAGNOSIS — R0602 Shortness of breath: Secondary | ICD-10-CM

## 2018-06-22 LAB — PULMONARY FUNCTION TEST

## 2018-06-23 ENCOUNTER — Encounter: Payer: Self-pay | Admitting: Internal Medicine

## 2018-06-23 ENCOUNTER — Ambulatory Visit (INDEPENDENT_AMBULATORY_CARE_PROVIDER_SITE_OTHER): Payer: Medicare Other | Admitting: Internal Medicine

## 2018-06-23 VITALS — BP 122/86 | HR 91 | Ht 63.0 in | Wt 196.0 lb

## 2018-06-23 DIAGNOSIS — F17200 Nicotine dependence, unspecified, uncomplicated: Secondary | ICD-10-CM | POA: Diagnosis not present

## 2018-06-23 DIAGNOSIS — G4733 Obstructive sleep apnea (adult) (pediatric): Secondary | ICD-10-CM

## 2018-06-23 DIAGNOSIS — R0609 Other forms of dyspnea: Secondary | ICD-10-CM

## 2018-06-23 DIAGNOSIS — R0602 Shortness of breath: Secondary | ICD-10-CM | POA: Diagnosis not present

## 2018-06-23 NOTE — Progress Notes (Signed)
[facility] [address] [place], [place] [address]  Pulmonary Sleep Medicine   Office Visit Note  Patient Name: Penny Dickson DOB: 04/06/1961 MRN [id number]  Date of Service: 06/23/2018  Complaints/HPI: Patient is here for follow-up on sleep study, PFTs, and echo.  Her echo showed mild TR and MR.  She also had some diastolic dysfunction and a normal left ventricular ejection fraction.  Her sleep study showed the presence of mild shortness sleep apnea with an overall AHI of 10.8/h.  Patient will require a CPAP titration study in order to get the CPAP machine.  ROS  General: (-) fever, (-) chills, (-) night sweats, (-) weakness Skin: (-) rashes, (-) itching,. Eyes: (-) visual changes, (-) redness, (-) itching. Nose and Sinuses: (-) nasal stuffiness or itchiness, (-) postnasal drip, (-) nosebleeds, (-) sinus trouble. Mouth and Throat: (-) sore throat, (-) hoarseness. Neck: (-) swollen glands, (-) enlarged thyroid, (-) neck pain. Respiratory: - cough, (-) bloody sputum, - shortness of breath, - wheezing. Cardiovascular: - ankle swelling, (-) chest pain. Lymphatic: (-) lymph node enlargement. Neurologic: (-) numbness, (-) tingling. Psychiatric: (-) anxiety, (-) depression   Current Medication: Outpatient Encounter Medications as of 06/23/2018  Medication Sig  . albuterol (PROVENTIL) (2.5 MG/3ML) 0.083% nebulizer solution Take 2.5 mg by nebulization every 4 (four) hours as needed for shortness of breath or cough.  . cetirizine (ZYRTEC) 10 MG tablet Take 10 mg by mouth daily.  . chlorthalidone (HYGROTON) 25 MG tablet Take 25 mg by mouth every morning.  . Fluticasone-Salmeterol (ADVAIR) 500-50 MCG/DOSE AEPB Inhale 1 puff into the lungs 2 (two) times daily.  Marland Kitchen gabapentin (NEURONTIN) 300 MG capsule Take 300 mg by mouth daily.  . Naproxen Sodium 220 MG CAPS Take 440 mg by mouth 2 (two) times daily.  . potassium chloride SA (K-DUR,KLOR-CON) 20 MEQ tablet Take 40 mEq by mouth  daily.   Marland Kitchen venlafaxine (EFFEXOR) 75 MG tablet Take 75 mg by mouth daily.  Marland Kitchen venlafaxine XR (EFFEXOR-XR) 150 MG 24 hr capsule Take 150 mg by mouth daily.   No facility-administered encounter medications on file as of 06/23/2018.     Surgical History: History reviewed. No pertinent surgical history.  Medical History: Past Medical History:  Diagnosis Date  . COPD (chronic obstructive pulmonary disease) (HCC)   . COPD (chronic obstructive pulmonary disease) (HCC)   . Depression   . Emphysema lung (HCC)   . Heart disease   . Hypertension     Family History: Family History  Problem Relation Age of Onset  . COPD Mother   . Heart failure Mother   . CAD Father     Social History: Social History   Socioeconomic History  . Marital status: Single    Spouse name: Not on file  . Number of children: Not on file  . Years of education: Not on file  . Highest education level: Not on file  Occupational History  . Not on file  Social Needs  . Financial resource strain: Not on file  . Food insecurity:    Worry: Not on file    Inability: Not on file  . Transportation needs:    Medical: Not on file    Non-medical: Not on file  Tobacco Use  . Smoking status: Current Every Day Smoker    Types: Cigars  . Smokeless tobacco: Never Used  Substance and Sexual Activity  . Alcohol use: Never    Frequency: Never  . Drug use: Never  . Sexual activity:  Not on file  Lifestyle  . Physical activity:    Days per week: Not on file    Minutes per session: Not on file  . Stress: Not on file  Relationships  . Social connections:    Talks on phone: Not on file    Gets together: Not on file    Attends religious service: Not on file    Active member of club or organization: Not on file    Attends meetings of clubs or organizations: Not on file    Relationship status: Not on file  . Intimate partner violence:    Fear of current or ex partner: Not on file    Emotionally abused: Not on file     Physically abused: Not on file    Forced sexual activity: Not on file  Other Topics Concern  . Not on file  Social History Narrative   With mother, independent at baseline    Vital Signs: Blood pressure 122/86, pulse 91, height 5\' 3"  (1.6 m), weight 196 lb (88.9 kg), SpO2 96 %.  Examination: General Appearance: The patient is well-developed, well-nourished, and in no distress. Skin: Gross inspection of skin unremarkable. Head: normocephalic, no gross deformities. Eyes: no gross deformities noted. ENT: ears appear grossly normal no exudates. Neck: Supple. No thyromegaly. No LAD. Respiratory: clear bilaterally. Cardiovascular: Normal S1 and S2 without murmur or rub. Extremities: No cyanosis. pulses are equal. Neurologic: Alert and oriented. No involuntary movements.  LABS: Recent Results (from the past 2160 hour(s))  CBC with Differential     Status: Abnormal   Collection Time: 05/16/18  8:42 AM  Result Value Ref Range   WBC 7.7 4.0 - 10.5 K/uL   RBC 5.06 3.87 - 5.11 MIL/uL   Hemoglobin 16.0 (H) 12.0 - 15.0 g/dL   HCT 82.4 (H) 23.5 - 36.1 %   MCV 92.3 80.0 - 100.0 fL   MCH 31.6 26.0 - 34.0 pg   MCHC 34.3 30.0 - 36.0 g/dL   RDW 44.3 15.4 - 00.8 %   Platelets 212 150 - 400 K/uL   nRBC 0.0 0.0 - 0.2 %   Neutrophils Relative % 68 %   Neutro Abs 5.1 1.7 - 7.7 K/uL   Lymphocytes Relative 24 %   Lymphs Abs 1.9 0.7 - 4.0 K/uL   Monocytes Relative 7 %   Monocytes Absolute 0.6 0.1 - 1.0 K/uL   Eosinophils Relative 1 %   Eosinophils Absolute 0.1 0.0 - 0.5 K/uL   Basophils Relative 0 %   Basophils Absolute 0.0 0.0 - 0.1 K/uL   Immature Granulocytes 0 %   Abs Immature Granulocytes 0.02 0.00 - 0.07 K/uL    Comment: Performed at [facility], [address] [place], [place] [address]  Comprehensive metabolic panel     Status: Abnormal   Collection Time: 05/16/18  8:42 AM  Result Value Ref Range   Sodium 137 135 - 145 mmol/L   Potassium 3.4 (L) 3.5 - 5.1 mmol/L    Chloride 99 98 - 111 mmol/L   CO2 29 22 - 32 mmol/L   Glucose, Bld 104 (H) 70 - 99 mg/dL   BUN 11 6 - 20 mg/dL   Creatinine, Ser 5.09 (H) 0.44 - 1.00 mg/dL   Calcium 9.1 8.9 - 32.6 mg/dL   Total Protein 7.1 6.5 - 8.1 g/dL   Albumin 4.0 3.5 - 5.0 g/dL   AST 18 15 - 41 U/L   ALT 18 0 - 44 U/L  Alkaline Phosphatase 57 38 - 126 U/L   Total Bilirubin 0.8 0.3 - 1.2 mg/dL   GFR calc non Af Amer 60 (L) >60 mL/min   GFR calc Af Amer >60 >60 mL/min   Anion gap 9 5 - 15    Comment: Performed at [facility], [address] [place], [place] [address]  Fibrin derivatives D-Dimer     Status: None   Collection Time: 05/16/18  8:42 AM  Result Value Ref Range   Fibrin derivatives D-dimer ([facility]) 440.18 0.00 - 499.00 ng/mL (FEU)    Comment: (NOTE) <> Exclusion of Venous Thromboembolism (VTE) - OUTPATIENT ONLY   (Emergency Department or [place])   0-499 ng/ml (FEU): With a low to intermediate pretest probability                      for VTE this test result excludes the diagnosis                      of VTE.   >499 ng/ml (FEU) : VTE not excluded; additional work up for VTE is                      required. <> Testing on Inpatients and Evaluation of Disseminated Intravascular   Coagulation (DIC) Reference Range:   0-499 ng/ml (FEU) Performed at [facility], [address] [place], [place] [address]   Troponin I - Once     Status: None   Collection Time: 05/16/18  8:42 AM  Result Value Ref Range   Troponin I <0.03 <0.03 ng/mL    Comment: Performed at [facility], [address] [place], [place] [address]  Culture, blood (routine x 2)     Status: None   Collection Time: 05/16/18 12:04 PM  Result Value Ref Range   Specimen Description BLOOD BLOOD LEFT FOREARM    Special Requests      BOTTLES DRAWN AEROBIC AND ANAEROBIC Blood Culture results may not be optimal due to an excessive volume of blood received in culture bottles   Culture      NO GROWTH 5  DAYS Performed at [facility], [address] [place], [place] [address]    Report Status 05/21/2018 FINAL   Culture, blood (routine x 2)     Status: None   Collection Time: 05/16/18 12:04 PM  Result Value Ref Range   Specimen Description BLOOD BLOOD LEFT HAND    Special Requests      BOTTLES DRAWN AEROBIC AND ANAEROBIC Blood Culture adequate volume   Culture      NO GROWTH 5 DAYS Performed at [facility], [address] [place], [place] [address]    Report Status 05/21/2018 FINAL   ANA Comprehensive Panel     Status: None   Collection Time: 05/16/18  9:45 PM  Result Value Ref Range   ds DNA Ab 1 0 - 9 IU/mL    Comment: (NOTE)                                   Negative      <5                                   Equivocal  5 - 9  Positive      >9    Ribonucleic Protein <0.2 0.0 - 0.9 AI   ENA SM Ab Ser-aCnc <0.2 0.0 - 0.9 AI   Scleroderma (Scl-70) (ENA) Antibody, IgG <0.2 0.0 - 0.9 AI   SSA (Ro) (ENA) Antibody, IgG <0.2 0.0 - 0.9 AI   SSB (La) (ENA) Antibody, IgG <0.2 0.0 - 0.9 AI   Chromatin Ab SerPl-aCnc <0.2 0.0 - 0.9 AI   Anti JO-1 <0.2 0.0 - 0.9 AI   Centromere Ab Screen <0.2 0.0 - 0.9 AI   See below: Comment     Comment: (NOTE) Autoantibody                       Disease Association ------------------------------------------------------------                        Condition                  Frequency ---------------------   ------------------------   --------- Antinuclear Antibody,    SLE, mixed connective Direct (ANA-D)           tissue diseases ---------------------   ------------------------   --------- dsDNA                    SLE                        40 - 60% ---------------------   ------------------------   --------- Chromatin                Drug induced SLE                90%                         SLE                        48 - 97% ---------------------   ------------------------   --------- SSA  (Ro)                 SLE                        25 - 35%                         Sjogren's Syndrome         40 - 70%                         Neonatal Lupus                 100% ---------------------   ------------------------   --------- SSB (La)                 SLE                              10%                         Sjogren's Syndrome              30% ---------------------   -----------------------    --------- Sm (anti-Smith)          SLE  15 - 30% ---------------------   -----------------------    --------- RNP                      Mixed Connective Tissue                         Disease                         95% (U1 nRNP,                SLE                        30 - 50% anti-ribonucleoprotein)  Polymyositis and/or                         Dermatomyositis                 20% ---------------------   ------------------------   --------- Scl-70 (antiDNA          Scleroderma (diffuse)      20 - 35% topoisomerase)           Crest                           13% ---------------------   ------------------------   --------- Jo-1                     Polymyositis and/or                         Dermatomyositis            20 - 40% ---------------------   ------------------------   --------- Centromere B             Scleroderma -  Crest                         variant                         80% Performed At: [facility] [address] [place], [place] [id number] Jolene Schimke MD [id number]   Sedimentation rate     Status: None   Collection Time: 05/16/18  9:45 PM  Result Value Ref Range   Sed Rate 11 0 - 30 mm/hr    Comment: Performed at [facility], [address] [place], [place] [address]  C-reactive protein     Status: Abnormal   Collection Time: 05/16/18  9:45 PM  Result Value Ref Range   CRP 7.1 (H) <1.0 mg/dL    Comment: Performed at [facility], [address], [place], [place] [address]  Angiotensin converting enzyme      Status: None   Collection Time: 05/16/18  9:45 PM  Result Value Ref Range   Angiotensin-Converting Enzyme 54 14 - 82 U/L    Comment: (NOTE) Performed At: [facility] [address] [place], [place] [id number] Jolene Schimke MD [id number]   Rheumatoid factor     Status: Abnormal   Collection Time: 05/16/18  9:45 PM  Result Value Ref Range   Rhuematoid fact SerPl-aCnc 23.2 (H) 0.0 - 13.9 IU/mL    Comment: (NOTE) Performed At: [facility] [address] [place], [place] [id number] Clovis Riley  Claudie Fisherman MD VH:[id number]   CYCLIC CITRUL PEPTIDE ANTIBODY, IGG/IGA     Status: None   Collection Time: 05/16/18  9:45 PM  Result Value Ref Range   CCP Antibodies IgG/IgA 7 0 - 19 units    Comment: (NOTE)                          Negative               <20                          Weak positive      20 - 39                          Moderate positive  40 - 59                          Strong positive        >59 Performed At: [facility] [address] [place], [place] [id number] Jolene Schimke MD [id number]   HIV antibody (Routine Testing)     Status: None   Collection Time: 05/16/18  9:45 PM  Result Value Ref Range   HIV Screen 4th Generation wRfx Non Reactive Non Reactive    Comment: (NOTE) Performed At: [facility] [address] [place], [place] [id number] Jolene Schimke MD [id number]   Troponin I - Now Then Q6H     Status: None   Collection Time: 05/16/18  9:45 PM  Result Value Ref Range   Troponin I <0.03 <0.03 ng/mL    Comment: Performed at [facility], [address] [place], [place] [address]  Basic metabolic panel     Status: Abnormal   Collection Time: 05/17/18  2:39 AM  Result Value Ref Range   Sodium 138 135 - 145 mmol/L   Potassium 3.5 3.5 - 5.1 mmol/L   Chloride 102 98 - 111 mmol/L   CO2 26 22 - 32 mmol/L   Glucose, Bld 225 (H) 70 - 99 mg/dL   BUN 13 6 - 20 mg/dL   Creatinine, Ser 6.30 (H) 0.44 - 1.00 mg/dL    Calcium 9.3 8.9 - 16.0 mg/dL   GFR calc non Af Amer >60 >60 mL/min   GFR calc Af Amer >60 >60 mL/min   Anion gap 10 5 - 15    Comment: Performed at [facility] [address] [place], [place] [address]  CBC     Status: Abnormal   Collection Time: 05/17/18  2:39 AM  Result Value Ref Range   WBC 4.5 4.0 - 10.5 K/uL   RBC 4.94 3.87 - 5.11 MIL/uL   Hemoglobin 15.5 (H) 12.0 - 15.0 g/dL   HCT 35.5 73.2 - 20.2 %   MCV 92.7 80.0 - 100.0 fL   MCH 31.4 26.0 - 34.0 pg   MCHC 33.8 30.0 - 36.0 g/dL   RDW 54.2 70.6 - 23.7 %   Platelets 215 150 - 400 K/uL   nRBC 0.0 0.0 - 0.2 %    Comment: Performed at [facility], [address] [place], [place] [address]  Troponin I - Now Then Q6H     Status: None   Collection Time: 05/17/18  2:39 AM  Result Value Ref Range   Troponin I <0.03 <0.03 ng/mL    Comment: Performed at [facility], [address] [place], [place] [address]  Troponin I - Now Then Q6H     Status: None   Collection Time: 05/17/18  8:42 AM  Result Value Ref Range   Troponin I <0.03 <0.03 ng/mL    Comment: Performed at [facility], [address] [place], [place] [address]  Influenza panel by PCR (type A & B)     Status: None   Collection Time: 05/17/18  2:05 PM  Result Value Ref Range   Influenza A By PCR NEGATIVE NEGATIVE   Influenza B By PCR NEGATIVE NEGATIVE    Comment: (NOTE) The Xpert Xpress Flu assay is intended as an aid in the diagnosis of  influenza and should not be used as a sole basis for treatment.  This  assay is FDA approved for nasopharyngeal swab specimens only. Nasal  washings and aspirates are unacceptable for Xpert Xpress Flu testing. Performed at [facility], [address] [place], [place] [address]   Basic metabolic panel     Status: Abnormal   Collection Time: 05/18/18  3:30 AM  Result Value Ref Range   Sodium 140 135 - 145 mmol/L   Potassium 3.2 (L) 3.5 - 5.1 mmol/L   Chloride 103 98 - 111  mmol/L   CO2 29 22 - 32 mmol/L   Glucose, Bld 101 (H) 70 - 99 mg/dL   BUN 19 6 - 20 mg/dL   Creatinine, Ser 9.141.09 (H) 0.44 - 1.00 mg/dL   Calcium 8.6 (L) 8.9 - 10.3 mg/dL   GFR calc non Af Amer 56 (L) >60 mL/min   GFR calc Af Amer >60 >60 mL/min   Anion gap 8 5 - 15    Comment: Performed at [facility], [address] [place], [place] [address]  CBC     Status: None   Collection Time: 05/18/18  3:30 AM  Result Value Ref Range   WBC 6.0 4.0 - 10.5 K/uL   RBC 4.71 3.87 - 5.11 MIL/uL   Hemoglobin 14.8 12.0 - 15.0 g/dL   HCT 62.144.2 30.836.0 - 65.746.0 %   MCV 93.8 80.0 - 100.0 fL   MCH 31.4 26.0 - 34.0 pg   MCHC 33.5 30.0 - 36.0 g/dL   RDW 84.611.8 96.211.5 - 95.215.5 %   Platelets 200 150 - 400 K/uL   nRBC 0.0 0.0 - 0.2 %    Comment: Performed at [facility], [address] [place], [place] [address]  Magnesium     Status: None   Collection Time: 05/18/18  3:30 AM  Result Value Ref Range   Magnesium 2.2 1.7 - 2.4 mg/dL    Comment: Performed at [facility], [address] [place], [place] [address]    Radiology: Dg Chest 2 View  Result Date: 05/16/2018 CLINICAL DATA:  Left chest pain, shortness of breath EXAM: CHEST - 2 VIEW COMPARISON:  None. FINDINGS: Lungs are clear.  No pleural effusion or pneumothorax. The heart is normal in size. Visualized osseous structures are within normal limits. IMPRESSION: Normal chest radiographs. Electronically Signed   By: Charline BillsSriyesh  Krishnan M.D.   On: 05/16/2018 00:20   Ct Angio Chest Pe W And/or Wo Contrast  Result Date: 05/16/2018 CLINICAL DATA:  58 year old with left chest pain. Shortness of breath. EXAM: CT ANGIOGRAPHY CHEST WITH CONTRAST TECHNIQUE: Multidetector CT imaging of the chest was performed using the standard protocol during bolus administration of intravenous contrast. Multiplanar CT image reconstructions and MIPs were obtained to  evaluate the vascular anatomy. CONTRAST:  75mL OMNIPAQUE IOHEXOL 350 MG/ML SOLN COMPARISON:   None. FINDINGS: Cardiovascular: Satisfactory opacification of the pulmonary arteries to the segmental level. No evidence of pulmonary embolism. Normal heart size. No pericardial effusion. Normal caliber of the thoracic aorta. Atherosclerotic calcifications at the aortic arch and origin of the great vessels. Mediastinum/Nodes: No significant mediastinal or hilar lymphadenopathy. Left hilar tissue measures roughly 0.9 cm in the short axis on sequence 5, image 127. No axillary lymph node enlargement. Esophagus is unremarkable. Lungs/Pleura: Trachea and mainstem bronchi are patent. Peripheral densities in the posterior right lower lobe best seen on sequence 6, image 64. Evidence for mild centrilobular emphysema in the upper lungs. 4 mm peripheral nodule in the right upper lobe on sequence 6 image 16. Peripheral irregular densities in the posterior left lower lobe near the superior segment, best seen on sequence 6, image 55. Focal thickening along the left major fissure on sequence 6, image 53 probably represents a subpleural lymph node. Subtle density in the right upper lobe near the right major fissure on sequence 6, image 33 is nonspecific. Upper Abdomen: No acute abnormality in the upper abdomen. Musculoskeletal: No acute bone abnormality. Review of the MIP images confirms the above findings. IMPRESSION: 1. Negative for a pulmonary embolism. 2. Poorly defined peripheral opacities in both lower lobes, right side greater than left. Findings could be related to infectious or inflammatory process of unknown age. Acute process can not be excluded. Based on the underlying emphysema, recommend 3 month follow-up chest CT to ensure resolution of these densities. 3. Few scattered peripheral and pleural-based nodular densities, largest measuring roughly 4 mm. These small nodular densities are nonspecific. No follow-up needed if patient is low-risk (and has no known or suspected primary neoplasm). Non-contrast chest CT can be  considered in 12 months if patient is high-risk. This recommendation follows the consensus statement: Guidelines for Management of Incidental Pulmonary Nodules Detected on CT Images: From the Fleischner Society 2017; Radiology 2017; 284:228-243. 4. Aortic Atherosclerosis (ICD10-I70.0) and Emphysema (ICD10-J43.9). Electronically Signed   By: Richarda Overlie M.D.   On: 05/16/2018 09:52    No results found.  No results found.    Assessment and Plan: Patient Active Problem List   Diagnosis Date Noted  . Pleurisy 05/16/2018   1. Obstructive sleep apnea CPAP titration study ordered for patient.  Will see what optimal pressure is required for patient's home CPAP machine. - Cpap titration; Future  2. Nicotine dependence with current use Unfortunately patient continues to smoke we did discuss smoking cessation at this visit. Smoking cessation counseling: 1. Pt acknowledges the risks of long term smoking, she will try to quite smoking. 2. Options for different medications including nicotine products, chewing gum, patch etc, Wellbutrin and Chantix is discussed 3. Goal and date of compete cessation is discussed 4. Total time spent in smoking cessation is 15 min.   3. DOE (dyspnea on exertion) Patient reports some continued dyspnea on exertion a 6-minute walk was performed today and the patient's O2 saturation maintained above 92%.  4. SOB (shortness of breath) - 6 minute walk   General Counseling: I have discussed the findings of the evaluation and examination with Penny Dickson.  I have also discussed any further diagnostic evaluation thatmay be needed or ordered today. Penny Dickson verbalizes understanding of the findings of todays visit. We also reviewed her medications today and discussed drug interactions and side effects including but not limited excessive drowsiness and altered mental states. We also discussed that  there is always a risk not just to her but also people around her. she has been  encouraged to call the office with any questions or concerns that should arise related to todays visit.    Time spent: 30 This patient was seen by Blima Ledger AGNP-C in Collaboration with Dr. Freda Munro as a part of collaborative care agreement.   I have personally obtained a history, examined the patient, evaluated laboratory and imaging results, formulated the assessment and plan and placed orders.    Yevonne Pax, MD [facility] Pulmonary and Critical Care Sleep medicine

## 2018-06-23 NOTE — Procedures (Signed)
[facility] [address] [place], [place] [address]  Patient Name: Penny Dickson DOB: [birth year]   SLEEP STUDY INTERPRETATION  DATE OF SERVICE: June 07, 2018   SLEEP STUDY HISTORY: This patient is referred to the sleep lab for a baseline Polysomnography. Pertinent history includes a history of diagnosis of excessive daytime somnolence and snoring.  PROCEDURE: This overnight polysomnogram was performed using the Alice 5 acquisition system using the standard diagnostic protocol as outlined by the AASM. This includes 6 channels of EEG, 2 channelscannels of EOG, chin EMG, bilateral anterior tibialis EMG, nasal/oral thermister, PTAF, chest and abdominal wall movements, ECG and pulse oximetry. Apneas and Hypopneas were scored per AASM definition.  SLEEP ARCHITECHTURE: This is a baseline polysomnograph  study. The total recording time was 386.4 minutes and the patients total sleep time is noted to be 316.5 minutes. Sleep onset latency was 9.0 minutes and is slightly decreased.  Stage R sleep onset latency was 148.0 minutes. Sleep maintenance efficiency was 85.8 % and is normal.  Sleep staging expressed as a percentage of total sleep time demonstrated 14.7 % N1, 41.5 % N2 and 27.2 % N3  sleep. Stage R represents 16.6 % of total sleep time. This is reduced.  There were a total of 120 arousals  for an overall arousal index of 22.7 per hour of sleep. PLMS arousal were not noted. Arousals without respiratory events are  noted. This can contribute to sleep architechture disruption.  RESPIRATORY MONITORING:   Patient exhibits significant evidence of sleep disorderd breathing characterized by 0 central apneas, 2 obstructive apneas and 0 mixed apneas. There were 55 obstructive hypopneas and 5 RERAs. Most of the apneas/hypopneas were of obstructive variety. The total apnea hypopnea index (apneas and hypopneas per hour of sleep) is 10.8 respiratory events per hour and is mild.  Respiratory  monitoring demonstrated severe snoring through the night. There are a total of 597 snoring episodes representing 58.8 % of sleep.   Baseline oxygen saturation during wakefulness was 95 % and during NREM sleep averaged 96 % through the night. Arterial saturation during REM sleep was 97 % through the night. There was significant  oxygen desaturation with the respiratory events. Arterial oxygen desaturation occurred of at least 4% was noted with a low saturation of 87 %. The study was performed off oxygen.  CARDIAC MONITORING:   Average heart rate is 69 during sleep with a high of 98 beats per minute. Malignant arrhythmias were not noted.    IMPRESSIONS:  --This overnight polysomnogram demonstrates presence of mild obstructive sleep apnea with an overall AHI 10.8 per hour. --The overall AHI was significantly worse  during Stage R. --There were associated some arterial oxygen desaturations noted down to 87% --There was no significant PLMS noted in this study. --There is severe snoring noted throughout the study.    RECOMMENDATIONS:  --CPAP titration study is indicated in this case to determine optimal pressure settings. --Nasal decongestants and antihistamines may be of help for increased upper airways resistance when present. --Weight loss through dietary and lifestyle modification is recommended in the presence of obesity. --A search for and treatment of any underlying cardiopulmonary disease is      recommended in the presence of oxygen desaturations. --Alternative treatment options if the patient is not willing to use CPAP include oral   appliances as well as surgical intervention which may help in the appropriate patient. --Clinical correlation is recommended. Please feel free to call the office for any further  questions or  assistance in the care of this patient.     Allyne Gee, MD [facility] Pulmonary Critical Care Medicine Sleep medicine

## 2018-06-29 NOTE — Procedures (Signed)
[facility] [address] [place], [address]  DATE OF SERVICE: June 22, 2018  Complete Pulmonary Function Testing Interpretation:  FINDINGS:  The forced vital capacity is normal.  The FEV1 is 1.5 L which is 71% of predicted and is mildly decreased.  The FEV1 FVC ratio is mildly decreased.  Postbronchodilator there is no significant improvement in the FEV1 however clinical improvement may occur in the absence of spirometric improvement.  Total lung capacity is increased residual volume is increased residual volume total capacity ratio is increased DLCO is moderately decreased.  IMPRESSION:  This pulmonary function study is consistent with mild obstructive lung disease.  The DLCO is moderately decreased.  Postbronchodilator there is no significant change in the FEV1  Yevonne Pax, MD [facility] Pulmonary Critical Care Medicine Sleep Medicine

## 2018-07-05 ENCOUNTER — Encounter (INDEPENDENT_AMBULATORY_CARE_PROVIDER_SITE_OTHER): Payer: Medicare Other | Admitting: Internal Medicine

## 2018-07-05 DIAGNOSIS — G4733 Obstructive sleep apnea (adult) (pediatric): Secondary | ICD-10-CM

## 2018-07-17 NOTE — Procedures (Signed)
[facility] [address] [place], [place] [address]  Patient Name: Penny Dickson DOB: 1961/04/19   SLEEP STUDY INTERPRETATION  DATE OF SERVICE: July 05, 2018   SLEEP STUDY HISTORY: This patient is referred to the sleep lab for a baseline Polysomnography. Pertinent history includes a history of diagnosis of excessive daytime somnolence and snoring.  PROCEDURE: This overnight polysomnogram was performed using the Alice 5 acquisition system using the standard diagnostic protocol as outlined by the AASM. This includes 6 channels of EEG, 2 channelscannels of EOG, chin EMG, bilateral anterior tibialis EMG, nasal/oral thermister, PTAF, chest and abdominal wall movements, ECG and pulse oximetry. Apneas and Hypopneas were scored per AASM definition.  SLEEP ARCHITECHTURE: This is a baseline polysomnograph  study. The total recording time was 382.8 minutes and the patients total sleep time is noted to be 270.5 minutes. Sleep onset latency was 13.0 minutes and is within normal limits.  Stage R sleep onset latency was 240.5 minutes. Sleep maintenance efficiency was 71.0 % and is decreased.  Sleep staging expressed as a percentage of total sleep time demonstrated 23.7 % N1, 31.8 % N2 and 30.6 % N3  sleep. Stage R represents 10.9 % of total sleep time. This is decreased.  There were a total of 64 arousals  for an overall arousal index of 14.2 per hour of sleep. PLMS arousal were not noted. Arousals without respiratory events are  noted. This can contribute to sleep architechture disruption.  RESPIRATORY MONITORING:   Patient exhibits some evidence of sleep disorderd breathing characterized by 3 central apneas, 12 obstructive apneas and 0 mixed apneas. There were 3 obstructive hypopneas and 2 RERAs. Most of the apneas/hypopneas were of obstructive variety. The total apnea hypopnea index (apneas and hypopneas per hour of sleep) is 4.0 respiratory events per hour and is within normal  limits.  Respiratory monitoring demonstrated very mild snoring through the night. There are a total of 13 snoring episodes representing 1.1 % of sleep.   Baseline oxygen saturation during wakefulness was 99 % and during NREM sleep averaged 99 % through the night. Arterial saturation during REM sleep was 99 % through the night. There was not any significant  oxygen desaturation with the respiratory events. Arterial oxygen desaturation occurred of at least 4% was noted with a low saturation of 95 %. The study was performed off oxygen.  CARDIAC MONITORING:   Average heart rate is 82 during sleep with a high of 103 beats per minute. Malignant arrhythmias were not noted.  CPAP titration: Patient was started on CPAP at a pressure of 5 cm water pressure and titrated to a maximum pressure of 8 cm water pressure.  Patient had good response to therapy on the CPAP and it appears that the optimal pressure is CPAP 8 cm water pressure for 62.9 minutes of sleep including stage R sleep patient had 2 apneas and no hypopneas for a apnea hypopnea index of 1.9/h.  The lowest saturation noted was 95%   IMPRESSIONS:  --This overnight CPAP titration polysomnogram demonstrates presence of sleep apnea with an overall AHI 4.0 per hour. --The overall AHI was no worse  during Stage R. --There were associated no significant arterial oxygen desaturations noted with a lowest saturation of 95% --There was no significant PLMS noted in this study. --There is very mild snoring noted throughout the study.    RECOMMENDATIONS:  --CPAP titration study is adequate to control the patient's respiratory events.  The optimal pressure is 8 cm water pressure.. --Nasal  decongestants and antihistamines may be of help for increased upper airways resistance when present. --Weight loss through dietary and lifestyle modification is recommended in the presence of obesity. --A search for and treatment of any underlying cardiopulmonary  disease is      recommended in the presence of oxygen desaturations. --Alternative treatment options if the patient is not willing to use CPAP include oral   appliances as well as surgical intervention which may help in the appropriate patient. --Clinical correlation is recommended. Please feel free to call the office for any further  questions or assistance in the care of this patient.     Yevonne Pax, MD [facility] Pulmonary Critical Care Medicine Sleep medicine

## 2018-07-18 ENCOUNTER — Ambulatory Visit: Payer: Self-pay | Admitting: Internal Medicine

## 2018-07-21 ENCOUNTER — Ambulatory Visit (INDEPENDENT_AMBULATORY_CARE_PROVIDER_SITE_OTHER): Payer: Medicare Other | Admitting: Adult Health

## 2018-07-21 ENCOUNTER — Encounter: Payer: Self-pay | Admitting: Adult Health

## 2018-07-21 VITALS — BP 122/78 | HR 91 | Resp 16 | Ht 63.0 in | Wt 196.0 lb

## 2018-07-21 DIAGNOSIS — R0609 Other forms of dyspnea: Secondary | ICD-10-CM

## 2018-07-21 DIAGNOSIS — E6609 Other obesity due to excess calories: Secondary | ICD-10-CM

## 2018-07-21 DIAGNOSIS — G4733 Obstructive sleep apnea (adult) (pediatric): Secondary | ICD-10-CM

## 2018-07-21 DIAGNOSIS — Z6834 Body mass index (BMI) 34.0-34.9, adult: Secondary | ICD-10-CM | POA: Diagnosis not present

## 2018-07-21 DIAGNOSIS — F17200 Nicotine dependence, unspecified, uncomplicated: Secondary | ICD-10-CM | POA: Diagnosis not present

## 2018-07-21 NOTE — Progress Notes (Signed)
[facility] [address] [place], [place] [address]  Pulmonary Sleep Medicine   Office Visit Note  Patient Name: Penny Dickson DOB: 01-25-1961 MRN [id number]  Date of Service: 07/21/2018  Complaints/HPI: Pt is here for follow upon cpap titration study.  She reports feelign much better the day after having her study.  Her study shows that a pressure of 8 cm water pressure will be sufficient.  Will order patients CPAP machine at this time.   ROS  General: (-) fever, (-) chills, (-) night sweats, (-) weakness Skin: (-) rashes, (-) itching,. Eyes: (-) visual changes, (-) redness, (-) itching. Nose and Sinuses: (-) nasal stuffiness or itchiness, (-) postnasal drip, (-) nosebleeds, (-) sinus trouble. Mouth and Throat: (-) sore throat, (-) hoarseness. Neck: (-) swollen glands, (-) enlarged thyroid, (-) neck pain. Respiratory: - cough, (-) bloody sputum, - shortness of breath, - wheezing. Cardiovascular: - ankle swelling, (-) chest pain. Lymphatic: (-) lymph node enlargement. Neurologic: (-) numbness, (-) tingling. Psychiatric: (-) anxiety, (-) depression   Current Medication: Outpatient Encounter Medications as of 07/21/2018  Medication Sig  . albuterol (PROVENTIL) (2.5 MG/3ML) 0.083% nebulizer solution Take 2.5 mg by nebulization every 4 (four) hours as needed for shortness of breath or cough.  . cetirizine (ZYRTEC) 10 MG tablet Take 10 mg by mouth daily.  . chlorthalidone (HYGROTON) 25 MG tablet Take 25 mg by mouth every morning.  . Fluticasone-Salmeterol (ADVAIR) 500-50 MCG/DOSE AEPB Inhale 1 puff into the lungs 2 (two) times daily.  Marland Kitchen gabapentin (NEURONTIN) 300 MG capsule Take 300 mg by mouth daily.  . Naproxen Sodium 220 MG CAPS Take 440 mg by mouth 2 (two) times daily.  . potassium chloride SA (K-DUR,KLOR-CON) 20 MEQ tablet Take 40 mEq by mouth daily.   Marland Kitchen venlafaxine (EFFEXOR) 75 MG tablet Take 75 mg by mouth daily.  Marland Kitchen venlafaxine XR (EFFEXOR-XR) 150 MG 24 hr  capsule Take 150 mg by mouth daily.   No facility-administered encounter medications on file as of 07/21/2018.     Surgical History: History reviewed. No pertinent surgical history.  Medical History: Past Medical History:  Diagnosis Date  . COPD (chronic obstructive pulmonary disease) (HCC)   . COPD (chronic obstructive pulmonary disease) (HCC)   . Depression   . Emphysema lung (HCC)   . Heart disease   . Hypertension     Family History: Family History  Problem Relation Age of Onset  . COPD Mother   . Heart failure Mother   . CAD Father     Social History: Social History   Socioeconomic History  . Marital status: Single    Spouse name: Not on file  . Number of children: Not on file  . Years of education: Not on file  . Highest education level: Not on file  Occupational History  . Not on file  Social Needs  . Financial resource strain: Not on file  . Food insecurity:    Worry: Not on file    Inability: Not on file  . Transportation needs:    Medical: Not on file    Non-medical: Not on file  Tobacco Use  . Smoking status: Current Every Day Smoker    Types: Cigars  . Smokeless tobacco: Never Used  Substance and Sexual Activity  . Alcohol use: Never    Frequency: Never  . Drug use: Never  . Sexual activity: Not on file  Lifestyle  . Physical activity:    Days per week: Not on file  Minutes per session: Not on file  . Stress: Not on file  Relationships  . Social connections:    Talks on phone: Not on file    Gets together: Not on file    Attends religious service: Not on file    Active member of club or organization: Not on file    Attends meetings of clubs or organizations: Not on file    Relationship status: Not on file  . Intimate partner violence:    Fear of current or ex partner: Not on file    Emotionally abused: Not on file    Physically abused: Not on file    Forced sexual activity: Not on file  Other Topics Concern  . Not on file  Social  History Narrative   With mother, independent at baseline    Vital Signs: Blood pressure 122/78, pulse 91, resp. rate 16, height  (1.6 m), weight 196 lb (88.9 kg), SpO2 94 %.  Examination: General Appearance: The patient is well-developed, well-nourished, and in no distress. Skin: Gross inspection of skin unremarkable. Head: normocephalic, no gross deformities. Eyes: no gross deformities noted. ENT: ears appear grossly normal no exudates. Neck: Supple. No thyromegaly. No LAD. Respiratory: clear bilateral. Cardiovascular: Normal S1 and S2 without murmur or rub. Extremities: No cyanosis. pulses are equal. Neurologic: Alert and oriented. No involuntary movements.  LABS: Recent Results (from the past 2160 hour(s))  CBC with Differential     Status: Abnormal   Collection Time: 05/16/18  8:42 AM  Result Value Ref Range   WBC 7.7 4.0 - 10.5 K/uL   RBC 5.06 3.87 - 5.11 MIL/uL   Hemoglobin 16.0 (H) 12.0 - 15.0 g/dL   HCT 84.1 (H) 32.4 - 40.1 %   MCV 92.3 80.0 - 100.0 fL   MCH 31.6 26.0 - 34.0 pg   MCHC 34.3 30.0 - 36.0 g/dL   RDW 02.7 25.3 - 66.4 %   Platelets 212 150 - 400 K/uL   nRBC 0.0 0.0 - 0.2 %   Neutrophils Relative % 68 %   Neutro Abs 5.1 1.7 - 7.7 K/uL   Lymphocytes Relative 24 %   Lymphs Abs 1.9 0.7 - 4.0 K/uL   Monocytes Relative 7 %   Monocytes Absolute 0.6 0.1 - 1.0 K/uL   Eosinophils Relative 1 %   Eosinophils Absolute 0.1 0.0 - 0.5 K/uL   Basophils Relative 0 %   Basophils Absolute 0.0 0.0 - 0.1 K/uL   Immature Granulocytes 0 %   Abs Immature Granulocytes 0.02 0.00 - 0.07 K/uL    Comment: Performed at [facility], [address] [place], [place] [address]  Comprehensive metabolic panel     Status: Abnormal   Collection Time: 05/16/18  8:42 AM  Result Value Ref Range   Sodium 137 135 - 145 mmol/L   Potassium 3.4 (L) 3.5 - 5.1 mmol/L   Chloride 99 98 - 111 mmol/L   CO2 29 22 - 32 mmol/L   Glucose, Bld 104 (H) 70 - 99 mg/dL   BUN 11 6 - 20  mg/dL   Creatinine, Ser 4.25 (H) 0.44 - 1.00 mg/dL   Calcium 9.1 8.9 - 95.6 mg/dL   Total Protein 7.1 6.5 - 8.1 g/dL   Albumin 4.0 3.5 - 5.0 g/dL   AST 18 15 - 41 U/L   ALT 18 0 - 44 U/L   Alkaline Phosphatase 57 38 - 126 U/L   Total Bilirubin 0.8 0.3 - 1.2 mg/dL  GFR calc non Af Amer 60 (L) >60 mL/min   GFR calc Af Amer >60 >60 mL/min   Anion gap 9 5 - 15    Comment: Performed at [facility], [address] [place], [place] [address]  Fibrin derivatives D-Dimer     Status: None   Collection Time: 05/16/18  8:42 AM  Result Value Ref Range   Fibrin derivatives D-dimer ([facility]) 440.18 0.00 - 499.00 ng/mL (FEU)    Comment: (NOTE) <> Exclusion of Venous Thromboembolism (VTE) - OUTPATIENT ONLY   (Emergency Department or [place])   0-499 ng/ml (FEU): With a low to intermediate pretest probability                      for VTE this test result excludes the diagnosis                      of VTE.   >499 ng/ml (FEU) : VTE not excluded; additional work up for VTE is                      required. <> Testing on Inpatients and Evaluation of Disseminated Intravascular   Coagulation (DIC) Reference Range:   0-499 ng/ml (FEU) Performed at [facility], [address] [place], [place] [address]   Troponin I - Once     Status: None   Collection Time: 05/16/18  8:42 AM  Result Value Ref Range   Troponin I <0.03 <0.03 ng/mL    Comment: Performed at [facility], [address] [place], [place] [address]  Culture, blood (routine x 2)     Status: None   Collection Time: 05/16/18 12:04 PM  Result Value Ref Range   Specimen Description BLOOD BLOOD LEFT FOREARM    Special Requests      BOTTLES DRAWN AEROBIC AND ANAEROBIC Blood Culture results may not be optimal due to an excessive volume of blood received in culture bottles   Culture      NO GROWTH 5 DAYS Performed at [facility], [address], [place], [place] [address]    Report Status 05/21/2018  FINAL   Culture, blood (routine x 2)     Status: None   Collection Time: 05/16/18 12:04 PM  Result Value Ref Range   Specimen Description BLOOD BLOOD LEFT HAND    Special Requests      BOTTLES DRAWN AEROBIC AND ANAEROBIC Blood Culture adequate volume   Culture      NO GROWTH 5 DAYS Performed at [facility], [address] [place], [place] [address]    Report Status 05/21/2018 FINAL   ANA Comprehensive Panel     Status: None   Collection Time: 05/16/18  9:45 PM  Result Value Ref Range   ds DNA Ab 1 0 - 9 IU/mL    Comment: (NOTE)                                   Negative      <5                                   Equivocal  5 - 9  Positive      >9    Ribonucleic Protein <0.2 0.0 - 0.9 AI   ENA SM Ab Ser-aCnc <0.2 0.0 - 0.9 AI   Scleroderma (Scl-70) (ENA) Antibody, IgG <0.2 0.0 - 0.9 AI   SSA (Ro) (ENA) Antibody, IgG <0.2 0.0 - 0.9 AI   SSB (La) (ENA) Antibody, IgG <0.2 0.0 - 0.9 AI   Chromatin Ab SerPl-aCnc <0.2 0.0 - 0.9 AI   Anti JO-1 <0.2 0.0 - 0.9 AI   Centromere Ab Screen <0.2 0.0 - 0.9 AI   See below: Comment     Comment: (NOTE) Autoantibody                       Disease Association ------------------------------------------------------------                        Condition                  Frequency ---------------------   ------------------------   --------- Antinuclear Antibody,    SLE, mixed connective Direct (ANA-D)           tissue diseases ---------------------   ------------------------   --------- dsDNA                    SLE                        40 - 60% ---------------------   ------------------------   --------- Chromatin                Drug induced SLE                90%                         SLE                        48 - 97% ---------------------   ------------------------   --------- SSA (Ro)                 SLE                        25 - 35%                         Sjogren's Syndrome         40 -  70%                         Neonatal Lupus                 100% ---------------------   ------------------------   --------- SSB (La)                 SLE                              10%                         Sjogren's Syndrome              30% ---------------------   -----------------------    --------- Sm (anti-Smith)          SLE  15 - 30% ---------------------   -----------------------    --------- RNP                      Mixed Connective Tissue                         Disease                         95% (U1 nRNP,                SLE                        30 - 50% anti-ribonucleoprotein)  Polymyositis and/or                         Dermatomyositis                 20% ---------------------   ------------------------   --------- Scl-70 (antiDNA          Scleroderma (diffuse)      20 - 35% topoisomerase)           Crest                           13% ---------------------   ------------------------   --------- Jo-1                     Polymyositis and/or                         Dermatomyositis            20 - 40% ---------------------   ------------------------   --------- Centromere B             Scleroderma -  Crest                         variant                         80% Performed At: [facility] [address] [place], [place] [id number] Jolene Schimke MD JO:[id number]   Sedimentation rate     Status: None   Collection Time: 05/16/18  9:45 PM  Result Value Ref Range   Sed Rate 11 0 - 30 mm/hr    Comment: Performed at [facility], [address] [place], [place] [address]  C-reactive protein     Status: Abnormal   Collection Time: 05/16/18  9:45 PM  Result Value Ref Range   CRP 7.1 (H) <1.0 mg/dL    Comment: Performed at [facility], [address] [place], [place] [address]  Angiotensin converting enzyme     Status: None   Collection Time: 05/16/18  9:45 PM  Result Value Ref Range   Angiotensin-Converting Enzyme 54  14 - 82 U/L    Comment: (NOTE) Performed At: [facility] [address] [place], [place] [id number] Jolene Schimke MD HC:[id number]   Rheumatoid factor     Status: Abnormal   Collection Time: 05/16/18  9:45 PM  Result Value Ref Range   Rhuematoid fact SerPl-aCnc 23.2 (H) 0.0 - 13.9 IU/mL    Comment: (NOTE) Performed At: [facility] [address] [place], [place] [id number] Clovis Riley  Claudie FishermanSanjai MD NW:[id number]:[phone]   CYCLIC CITRUL PEPTIDE ANTIBODY, IGG/IGA     Status: None   Collection Time: 05/16/18  9:45 PM  Result Value Ref Range   CCP Antibodies IgG/IgA 7 0 - 19 units    Comment: (NOTE)                          Negative               <20                          Weak positive      20 - 39                          Moderate positive  40 - 59                          Strong positive        >59 Performed At: [facility] [address] [place], [place] [id number] Jolene SchimkeNagendra Sanjai MD BM:[id number]:[phone]   HIV antibody (Routine Testing)     Status: None   Collection Time: 05/16/18  9:45 PM  Result Value Ref Range   HIV Screen 4th Generation wRfx Non Reactive Non Reactive    Comment: (NOTE) Performed At: [facility] [address] [place], [place] [id number] Jolene SchimkeNagendra Sanjai MD IH:[id number]:[phone]   Troponin I - Now Then Q6H     Status: None   Collection Time: 05/16/18  9:45 PM  Result Value Ref Range   Troponin I <0.03 <0.03 ng/mL    Comment: Performed at [facility], [address] [place], [place] [id number]  Basic metabolic panel     Status: Abnormal   Collection Time: 05/17/18  2:39 AM  Result Value Ref Range   Sodium 138 135 - 145 mmol/L   Potassium 3.5 3.5 - 5.1 mmol/L   Chloride 102 98 - 111 mmol/L   CO2 26 22 - 32 mmol/L   Glucose, Bld 225 (H) 70 - 99 mg/dL   BUN 13 6 - 20 mg/dL   Creatinine, Ser 3.291.01 (H) 0.44 - 1.00 mg/dL   Calcium 9.3 8.9 - 51.810.3 mg/dL   GFR calc non Af Amer >60 >60 mL/min   GFR calc Af Amer >60 >60 mL/min   Anion gap  10 5 - 15    Comment: Performed at [facility], [address] [place], [place] [address]  CBC     Status: Abnormal   Collection Time: 05/17/18  2:39 AM  Result Value Ref Range   WBC 4.5 4.0 - 10.5 K/uL   RBC 4.94 3.87 - 5.11 MIL/uL   Hemoglobin 15.5 (H) 12.0 - 15.0 g/dL   HCT 06.345.8 01.636.0 - 01.046.0 %   MCV 92.7 80.0 - 100.0 fL   MCH 31.4 26.0 - 34.0 pg   MCHC 33.8 30.0 - 36.0 g/dL   RDW 93.211.9 35.511.5 - 73.215.5 %   Platelets 215 150 - 400 K/uL   nRBC 0.0 0.0 - 0.2 %    Comment: Performed at [facility], [address] [place], [place] [id number]  Troponin I - Now Then Q6H     Status: None   Collection Time: 05/17/18  2:39 AM  Result Value Ref Range   Troponin I <0.03 <0.03 ng/mL    Comment: Performed at [facility], [address] [place], [place] [address]  Troponin I - Now Then Q6H     Status: None   Collection Time: 05/17/18  8:42 AM  Result Value Ref Range   Troponin I <0.03 <0.03 ng/mL    Comment: Performed at [facility], [address] [place], [place] [address]  Influenza panel by PCR (type A & B)     Status: None   Collection Time: 05/17/18  2:05 PM  Result Value Ref Range   Influenza A By PCR NEGATIVE NEGATIVE   Influenza B By PCR NEGATIVE NEGATIVE    Comment: (NOTE) The Xpert Xpress Flu assay is intended as an aid in the diagnosis of  influenza and should not be used as a sole basis for treatment.  This  assay is FDA approved for nasopharyngeal swab specimens only. Nasal  washings and aspirates are unacceptable for Xpert Xpress Flu testing. Performed at [facility], [address] [place], [place] [address]   Basic metabolic panel     Status: Abnormal   Collection Time: 05/18/18  3:30 AM  Result Value Ref Range   Sodium 140 135 - 145 mmol/L   Potassium 3.2 (L) 3.5 - 5.1 mmol/L   Chloride 103 98 - 111 mmol/L   CO2 29 22 - 32 mmol/L   Glucose, Bld 101 (H) 70 - 99 mg/dL   BUN 19 6 - 20 mg/dL   Creatinine, Ser 8.29  (H) 0.44 - 1.00 mg/dL   Calcium 8.6 (L) 8.9 - 10.3 mg/dL   GFR calc non Af Amer 56 (L) >60 mL/min   GFR calc Af Amer >60 >60 mL/min   Anion gap 8 5 - 15    Comment: Performed at [facility], [address] [place], [place] [address]  CBC     Status: None   Collection Time: 05/18/18  3:30 AM  Result Value Ref Range   WBC 6.0 4.0 - 10.5 K/uL   RBC 4.71 3.87 - 5.11 MIL/uL   Hemoglobin 14.8 12.0 - 15.0 g/dL   HCT 08.6 57.8 - 46.9 %   MCV 93.8 80.0 - 100.0 fL   MCH 31.4 26.0 - 34.0 pg   MCHC 33.5 30.0 - 36.0 g/dL   RDW 62.9 52.8 - 41.3 %   Platelets 200 150 - 400 K/uL   nRBC 0.0 0.0 - 0.2 %    Comment: Performed at [facility], [address], [place], [place] [address]  Magnesium     Status: None   Collection Time: 05/18/18  3:30 AM  Result Value Ref Range   Magnesium 2.2 1.7 - 2.4 mg/dL    Comment: Performed at [facility], [address] [place], [place] [address]  Pulmonary function test     Status: None   Collection Time: 06/22/18 11:30 AM  Result Value Ref Range   FEV1     FVC     FEV1/FVC     TLC     DLCO      Radiology: Dg Chest 2 View  Result Date: 05/16/2018 CLINICAL DATA:  Left chest pain, shortness of breath EXAM: CHEST - 2 VIEW COMPARISON:  None. FINDINGS: Lungs are clear.  No pleural effusion or pneumothorax. The heart is normal in size. Visualized osseous structures are within normal limits. IMPRESSION: Normal chest radiographs. Electronically Signed   By: Charline Bills M.D.   On: 05/16/2018 00:20   Ct Angio Chest Pe W And/or Wo Contrast  Result  Date: 05/16/2018 CLINICAL DATA:  58 year old with left chest pain. Shortness of breath. EXAM: CT ANGIOGRAPHY CHEST WITH CONTRAST TECHNIQUE: Multidetector CT imaging of the chest was performed using the standard protocol during bolus administration of intravenous contrast. Multiplanar CT image reconstructions and MIPs were obtained to evaluate the vascular anatomy. CONTRAST:  39mL  OMNIPAQUE IOHEXOL 350 MG/ML SOLN COMPARISON:  None. FINDINGS: Cardiovascular: Satisfactory opacification of the pulmonary arteries to the segmental level. No evidence of pulmonary embolism. Normal heart size. No pericardial effusion. Normal caliber of the thoracic aorta. Atherosclerotic calcifications at the aortic arch and origin of the great vessels. Mediastinum/Nodes: No significant mediastinal or hilar lymphadenopathy. Left hilar tissue measures roughly 0.9 cm in the short axis on sequence 5, image 127. No axillary lymph node enlargement. Esophagus is unremarkable. Lungs/Pleura: Trachea and mainstem bronchi are patent. Peripheral densities in the posterior right lower lobe best seen on sequence 6, image 64. Evidence for mild centrilobular emphysema in the upper lungs. 4 mm peripheral nodule in the right upper lobe on sequence 6 image 16. Peripheral irregular densities in the posterior left lower lobe near the superior segment, best seen on sequence 6, image 55. Focal thickening along the left major fissure on sequence 6, image 53 probably represents a subpleural lymph node. Subtle density in the right upper lobe near the right major fissure on sequence 6, image 33 is nonspecific. Upper Abdomen: No acute abnormality in the upper abdomen. Musculoskeletal: No acute bone abnormality. Review of the MIP images confirms the above findings. IMPRESSION: 1. Negative for a pulmonary embolism. 2. Poorly defined peripheral opacities in both lower lobes, right side greater than left. Findings could be related to infectious or inflammatory process of unknown age. Acute process can not be excluded. Based on the underlying emphysema, recommend 3 month follow-up chest CT to ensure resolution of these densities. 3. Few scattered peripheral and pleural-based nodular densities, largest measuring roughly 4 mm. These small nodular densities are nonspecific. No follow-up needed if patient is low-risk (and has no known or suspected  primary neoplasm). Non-contrast chest CT can be considered in 12 months if patient is high-risk. This recommendation follows the consensus statement: Guidelines for Management of Incidental Pulmonary Nodules Detected on CT Images: From the Fleischner Society 2017; Radiology 2017; 284:228-243. 4. Aortic Atherosclerosis (ICD10-I70.0) and Emphysema (ICD10-J43.9). Electronically Signed   By: Richarda Overlie M.D.   On: 05/16/2018 09:52    No results found.  No results found.    Assessment and Plan: Patient Active Problem List   Diagnosis Date Noted  . Pleurisy 05/16/2018    1. OSA (obstructive sleep apnea) Pts CPAP ordered.  - For home use only DME continuous positive airway pressure (CPAP)  2. Nicotine dependence with current use Smoking cessation counseling: 1. Pt acknowledges the risks of long term smoking, she will try to quite smoking. 2. Options for different medications including nicotine products, chewing gum, patch etc, Wellbutrin and Chantix is discussed 3. Goal and date of compete cessation is discussed 4. Total time spent in smoking cessation is 15 min.  3. DOE (dyspnea on exertion) Pt continues to reports intermittent DOE. She reports this was better, the day after her titration study.  She is hopefull that wearing a CPAP mask nightly will improve her symptoms further.    4. Class 1 obesity due to excess calories without serious comorbidity with body mass index (BMI) of 34.0 to 34.9 in adult Obesity Counseling: Risk Assessment: An assessment of behavioral risk factors was made today and  includes lack of exercise sedentary lifestyle, lack of portion control and poor dietary habits.  Risk Modification Advice: She was counseled on portion control guidelines. Restricting daily caloric intake to. . The detrimental long term effects of obesity on her health and ongoing poor compliance was also discussed with the patient.   General Counseling: I have discussed the findings of the  evaluation and examination with Jola Babinski.  I have also discussed any further diagnostic evaluation thatmay be needed or ordered today. Leylanie verbalizes understanding of the findings of todays visit. We also reviewed her medications today and discussed drug interactions and side effects including but not limited excessive drowsiness and altered mental states. We also discussed that there is always a risk not just to her but also people around her. she has been encouraged to call the office with any questions or concerns that should arise related to todays visit.    Time spent: 25 This patient was seen by Blima Ledger AGNP-C in Collaboration with Dr. Freda Munro as a part of collaborative care agreement.   I have personally obtained a history, examined the patient, evaluated laboratory and imaging results, formulated the assessment and plan and placed orders.    Yevonne Pax, MD [facility] Pulmonary and Critical Care Sleep medicine

## 2018-07-25 ENCOUNTER — Telehealth: Payer: Self-pay

## 2018-07-25 NOTE — Telephone Encounter (Signed)
Gave [facility] for new cpap and all documents and put copy in scan. Beth

## 2018-08-10 ENCOUNTER — Ambulatory Visit: Payer: Medicare Other

## 2018-09-14 ENCOUNTER — Ambulatory Visit (INDEPENDENT_AMBULATORY_CARE_PROVIDER_SITE_OTHER): Payer: Medicare Other

## 2018-09-14 ENCOUNTER — Other Ambulatory Visit: Payer: Self-pay

## 2018-09-14 DIAGNOSIS — G4733 Obstructive sleep apnea (adult) (pediatric): Secondary | ICD-10-CM | POA: Diagnosis not present

## 2018-09-14 NOTE — Progress Notes (Signed)
New cpap setup  Patient setup on resmed airsense S-10  cpap at 8 cmh2o with humidifier, tubing, filters and a resmed airfit F-20 full face mask size small.  She had good understand of using and cleaning cpap. Discussed if mask was not working she has a 30 day mask guarantee.   Also went over several times compliance wearing machine 5 hours every night and following up with Dr. Welton Flakes. No questions at this time

## 2018-10-12 ENCOUNTER — Other Ambulatory Visit: Payer: Self-pay

## 2018-10-12 ENCOUNTER — Ambulatory Visit (INDEPENDENT_AMBULATORY_CARE_PROVIDER_SITE_OTHER): Payer: Medicare Other

## 2018-10-12 DIAGNOSIS — G4733 Obstructive sleep apnea (adult) (pediatric): Secondary | ICD-10-CM

## 2018-10-12 NOTE — Progress Notes (Signed)
95 percentile pressure 8   95th percentile leak 86.0   apnea index 1.9 /hr  apnea-hypopnea index  2.9 /hr   total days used  >4 hr 28 days  total days used <4 hr 0 days  Total compliance 93 percent  She is doing good on cpap. We discussed the mask leak she states it is only on nights she gets really hot and sweats. I looked at past 2 nights and leak was down to 15, with AHI under 3 advised her to let me know if she wanted to try something different she declined at this time.

## 2018-10-26 ENCOUNTER — Ambulatory Visit (INDEPENDENT_AMBULATORY_CARE_PROVIDER_SITE_OTHER): Payer: Medicare Other | Admitting: Internal Medicine

## 2018-10-26 ENCOUNTER — Other Ambulatory Visit: Payer: Self-pay

## 2018-10-26 ENCOUNTER — Encounter: Payer: Self-pay | Admitting: Internal Medicine

## 2018-10-26 VITALS — BP 98/73 | HR 89 | Resp 16 | Ht 63.0 in | Wt 201.0 lb

## 2018-10-26 DIAGNOSIS — F17200 Nicotine dependence, unspecified, uncomplicated: Secondary | ICD-10-CM

## 2018-10-26 DIAGNOSIS — G4733 Obstructive sleep apnea (adult) (pediatric): Secondary | ICD-10-CM

## 2018-10-26 DIAGNOSIS — Z9989 Dependence on other enabling machines and devices: Secondary | ICD-10-CM | POA: Diagnosis not present

## 2018-10-26 DIAGNOSIS — E6609 Other obesity due to excess calories: Secondary | ICD-10-CM

## 2018-10-26 DIAGNOSIS — Z6834 Body mass index (BMI) 34.0-34.9, adult: Secondary | ICD-10-CM

## 2018-10-26 NOTE — Patient Instructions (Signed)

## 2018-10-26 NOTE — Progress Notes (Signed)
[facility] [place], [place] [address]  Pulmonary Sleep Medicine   Office Visit Note  Patient Name: Penny Dickson DOB: 04-28-1961 MRN [id number]  Date of Service: 10/26/2018  Complaints/HPI: Pt is here for follow up on cpap.  She has worn her machine greater than 4 hours for the last 28 days.  Doing well with 93% compliance.  She is cleaning her machine regularly and denies any issues at this time.  She reports good relief of symptoms, and is sleeping well with machine.    ROS  General: (-) fever, (-) chills, (-) night sweats, (-) weakness Skin: (-) rashes, (-) itching,. Eyes: (-) visual changes, (-) redness, (-) itching. Nose and Sinuses: (-) nasal stuffiness or itchiness, (-) postnasal drip, (-) nosebleeds, (-) sinus trouble. Mouth and Throat: (-) sore throat, (-) hoarseness. Neck: (-) swollen glands, (-) enlarged thyroid, (-) neck pain. Respiratory: - cough, (-) bloody sputum, - shortness of breath, - wheezing. Cardiovascular: - ankle swelling, (-) chest pain. Lymphatic: (-) lymph node enlargement. Neurologic: (-) numbness, (-) tingling. Psychiatric: (-) anxiety, (-) depression   Current Medication: Outpatient Encounter Medications as of 10/26/2018  Medication Sig  . albuterol (PROVENTIL) (2.5 MG/3ML) 0.083% nebulizer solution Take 2.5 mg by nebulization every 4 (four) hours as needed for shortness of breath or cough.  . cetirizine (ZYRTEC) 10 MG tablet Take 10 mg by mouth daily.  . Fluticasone-Salmeterol (ADVAIR) 500-50 MCG/DOSE AEPB Inhale 1 puff into the lungs 2 (two) times daily.  Marland Kitchen gabapentin (NEURONTIN) 300 MG capsule Take 300 mg by mouth daily.  . Naproxen Sodium 220 MG CAPS Take 440 mg by mouth 2 (two) times daily.  . NON FORMULARY cpap device  . potassium chloride SA (K-DUR,KLOR-CON) 20 MEQ tablet Take 40 mEq by mouth daily.   Marland Kitchen venlafaxine (EFFEXOR) 75 MG tablet Take 75 mg by mouth daily.  Marland Kitchen venlafaxine XR (EFFEXOR-XR) 150 MG 24 hr  capsule Take 150 mg by mouth daily.  . chlorthalidone (HYGROTON) 25 MG tablet Take 25 mg by mouth every morning.   No facility-administered encounter medications on file as of 10/26/2018.     Surgical History: History reviewed. No pertinent surgical history.  Medical History: Past Medical History:  Diagnosis Date  . COPD (chronic obstructive pulmonary disease) ([place])   . COPD (chronic obstructive pulmonary disease) ([place])   . Depression   . Emphysema lung ([place])   . Heart disease   . Hypertension     Family History: Family History  Problem Relation Age of Onset  . COPD Mother   . Heart failure Mother   . CAD Father     Social History: Social History   Socioeconomic History  . Marital status: Single    Spouse name: Not on file  . Number of children: Not on file  . Years of education: Not on file  . Highest education level: Not on file  Occupational History  . Not on file  Social Needs  . Financial resource strain: Not on file  . Food insecurity    Worry: Not on file    Inability: Not on file  . Transportation needs    Medical: Not on file    Non-medical: Not on file  Tobacco Use  . Smoking status: Current Every Day Smoker    Types: Cigars  . Smokeless tobacco: Never Used  Substance and Sexual Activity  . Alcohol use: Never    Frequency: Never  . Drug use: Never  . Sexual activity: Not on  file  Lifestyle  . Physical activity    Days per week: Not on file    Minutes per session: Not on file  . Stress: Not on file  Relationships  . Social Musicianconnections    Talks on phone: Not on file    Gets together: Not on file    Attends religious service: Not on file    Active member of club or organization: Not on file    Attends meetings of clubs or organizations: Not on file    Relationship status: Not on file  . Intimate partner violence    Fear of current or ex partner: Not on file    Emotionally abused: Not on file    Physically abused: Not on file    Forced  sexual activity: Not on file  Other Topics Concern  . Not on file  Social History Narrative   With mother, independent at baseline    Vital Signs: Blood pressure 98/73, pulse 89, resp. rate 16, height 5\' 3"  (1.6 m), weight 201 lb (91.2 kg), SpO2 97 %.  Examination: General Appearance: The patient is well-developed, well-nourished, and in no distress. Skin: Gross inspection of skin unremarkable. Head: normocephalic, no gross deformities. Eyes: no gross deformities noted. ENT: ears appear grossly normal no exudates. Neck: Supple. No thyromegaly. No LAD. Respiratory: clear bilateraly. Cardiovascular: Normal S1 and S2 without murmur or rub. Extremities: No cyanosis. pulses are equal. Neurologic: Alert and oriented. No involuntary movements.  LABS: No results found for this or any previous visit (from the past 2160 hour(s)).  Radiology: Dg Chest 2 View  Result Date: 05/16/2018 CLINICAL DATA:  Left chest pain, shortness of breath EXAM: CHEST - 2 VIEW COMPARISON:  None. FINDINGS: Lungs are clear.  No pleural effusion or pneumothorax. The heart is normal in size. Visualized osseous structures are within normal limits. IMPRESSION: Normal chest radiographs. Electronically Signed   By: Charline BillsSriyesh  Krishnan M.D.   On: 05/16/2018 00:20   Ct Angio Chest Pe W And/or Wo Contrast  Result Date: 05/16/2018 CLINICAL DATA:  58 year old with left chest pain. Shortness of breath. EXAM: CT ANGIOGRAPHY CHEST WITH CONTRAST TECHNIQUE: Multidetector CT imaging of the chest was performed using the standard protocol during bolus administration of intravenous contrast. Multiplanar CT image reconstructions and MIPs were obtained to evaluate the vascular anatomy. CONTRAST:  75mL OMNIPAQUE IOHEXOL 350 MG/ML SOLN COMPARISON:  None. FINDINGS: Cardiovascular: Satisfactory opacification of the pulmonary arteries to the segmental level. No evidence of pulmonary embolism. Normal heart size. No pericardial effusion. Normal  caliber of the thoracic aorta. Atherosclerotic calcifications at the aortic arch and origin of the great vessels. Mediastinum/Nodes: No significant mediastinal or hilar lymphadenopathy. Left hilar tissue measures roughly 0.9 cm in the short axis on sequence 5, image 127. No axillary lymph node enlargement. Esophagus is unremarkable. Lungs/Pleura: Trachea and mainstem bronchi are patent. Peripheral densities in the posterior right lower lobe best seen on sequence 6, image 64. Evidence for mild centrilobular emphysema in the upper lungs. 4 mm peripheral nodule in the right upper lobe on sequence 6 image 16. Peripheral irregular densities in the posterior left lower lobe near the superior segment, best seen on sequence 6, image 55. Focal thickening along the left major fissure on sequence 6, image 53 probably represents a subpleural lymph node. Subtle density in the right upper lobe near the right major fissure on sequence 6, image 33 is nonspecific. Upper Abdomen: No acute abnormality in the upper abdomen. Musculoskeletal: No acute bone abnormality. Review of  the MIP images confirms the above findings. IMPRESSION: 1. Negative for a pulmonary embolism. 2. Poorly defined peripheral opacities in both lower lobes, right side greater than left. Findings could be related to infectious or inflammatory process of unknown age. Acute process can not be excluded. Based on the underlying emphysema, recommend 3 month follow-up chest CT to ensure resolution of these densities. 3. Few scattered peripheral and pleural-based nodular densities, largest measuring roughly 4 mm. These small nodular densities are nonspecific. No follow-up needed if patient is low-risk (and has no known or suspected primary neoplasm). Non-contrast chest CT can be considered in 12 months if patient is high-risk. This recommendation follows the consensus statement: Guidelines for Management of Incidental Pulmonary Nodules Detected on CT Images: From the  Fleischner Society 2017; Radiology 2017; 284:228-243. 4. Aortic Atherosclerosis (ICD10-I70.0) and Emphysema (ICD10-J43.9). Electronically Signed   By: Richarda OverlieAdam  Henn M.D.   On: 05/16/2018 09:52    No results found.  No results found.    Assessment and Plan: Patient Active Problem List   Diagnosis Date Noted  . Pleurisy 05/16/2018    1. OSA on CPAP Continue with cpap as patient is having good results at this time.  Her overall compliance is 93% and her ahi is 2.9/hr.  2. Class 1 obesity due to excess calories without serious comorbidity with body mass index (BMI) of 34.0 to 34.9 in adult Obesity Counseling: Risk Assessment: An assessment of behavioral risk factors was made today and includes lack of exercise sedentary lifestyle, lack of portion control and poor dietary habits.  Risk Modification Advice: She was counseled on portion control guidelines. Restricting daily caloric intake to. . The detrimental long term effects of obesity on her health and ongoing poor compliance was also discussed with the patient.    3. Nicotine dependence with current use Smoking cessation counseling: 1. Pt acknowledges the risks of long term smoking, she will try to quite smoking. 2. Options for different medications including nicotine products, chewing gum, patch etc, Wellbutrin and Chantix is discussed 3. Goal and date of compete cessation is discussed 4. Total time spent in smoking cessation is 15 min.   General Counseling: I have discussed the findings of the evaluation and examination with Penny Dickson.  I have also discussed any further diagnostic evaluation thatmay be needed or ordered today. Penny Dickson verbalizes understanding of the findings of todays visit. We also reviewed her medications today and discussed drug interactions and side effects including but not limited excessive drowsiness and altered mental states. We also discussed that there is always a risk not just to her but also people around  her. she has been encouraged to call the office with any questions or concerns that should arise related to todays visit.    Time spent: 20 This patient was seen by Blima LedgerAdam Rebeca Valdivia AGNP-C in Collaboration with Dr. Freda MunroSaadat Khan as a part of collaborative care agreement.   I have personally obtained a history, examined the patient, evaluated laboratory and imaging results, formulated the assessment and plan and placed orders.    Yevonne PaxSaadat A Khan, MD [facility] Pulmonary and Critical Care Sleep medicine

## 2018-11-30 ENCOUNTER — Telehealth: Payer: Self-pay

## 2018-11-30 NOTE — Telephone Encounter (Signed)
NOTE DOCUMENTATION REQUEST COMPLETED AND FAXED. PLACED IN [employer].

## 2019-01-25 ENCOUNTER — Other Ambulatory Visit: Payer: Self-pay

## 2019-01-25 ENCOUNTER — Ambulatory Visit (INDEPENDENT_AMBULATORY_CARE_PROVIDER_SITE_OTHER): Payer: Medicare Other

## 2019-01-25 DIAGNOSIS — G4733 Obstructive sleep apnea (adult) (pediatric): Secondary | ICD-10-CM | POA: Diagnosis not present

## 2019-01-25 NOTE — Progress Notes (Signed)
95 percentile pressure 8    95th percentile leak 61.9   apnea index 1.0 /hr  apnea-hypopnea index  1.8 /hr   total days used  >4 hr 24 days  total days used <4 hr 4 days  Total compliance 80 percent  Doing great on cpap. States does crazy stuff during night, which explaines leaking on mask but states no problems or questions

## 2019-01-26 ENCOUNTER — Encounter: Payer: Self-pay | Admitting: Internal Medicine

## 2019-01-26 ENCOUNTER — Ambulatory Visit (INDEPENDENT_AMBULATORY_CARE_PROVIDER_SITE_OTHER): Payer: Medicare Other | Admitting: Internal Medicine

## 2019-01-26 VITALS — BP 114/80 | HR 85 | Resp 16 | Ht 63.0 in | Wt 201.0 lb

## 2019-01-26 DIAGNOSIS — Z9989 Dependence on other enabling machines and devices: Secondary | ICD-10-CM

## 2019-01-26 DIAGNOSIS — R0602 Shortness of breath: Secondary | ICD-10-CM | POA: Diagnosis not present

## 2019-01-26 DIAGNOSIS — Z122 Encounter for screening for malignant neoplasm of respiratory organs: Secondary | ICD-10-CM

## 2019-01-26 DIAGNOSIS — G4733 Obstructive sleep apnea (adult) (pediatric): Secondary | ICD-10-CM | POA: Diagnosis not present

## 2019-01-26 DIAGNOSIS — J449 Chronic obstructive pulmonary disease, unspecified: Secondary | ICD-10-CM

## 2019-01-26 DIAGNOSIS — R911 Solitary pulmonary nodule: Secondary | ICD-10-CM | POA: Diagnosis not present

## 2019-01-26 DIAGNOSIS — Z87891 Personal history of nicotine dependence: Secondary | ICD-10-CM

## 2019-01-26 NOTE — Progress Notes (Signed)
[facility] [address] [place], [place] [address]  Pulmonary Sleep Medicine   Office Visit Note  Patient Name: Penny Dickson DOB: October 04, 1958 MRN [id number]  Date of Service: 01/26/2019  Complaints/HPI: CPAP followup patient is here for follow-up of her CPAP.  She states she has been using it had a recent download her compliance was 80%.  She does have an issue with the mask with some expiratory leaking noted.  She is however overall feeling better feels less tired and less sleepy.  She continues to try to be compliant as much as possible.  Other issues are that he is still smoking.  She does not cough she was recently tested for COVID-19 which was negative reportedly.  We talked about her smoking she smokes cigars and she does inhale and a instructed her that she does need to actually quit smoking her last pulmonary function had shown that her capacity is approximately 71% FEV1 which would be consistent with COPD.  She did have a CT scan done back in January and was noted at that time to have some nodules.  The CT was done for angiogram which was unremarkable for pulmonary embolism but the nodules were discovered incidentally.  Patient in fact that she smokes and she has COPD she would be considered at increased risk so therefore she should have a follow-up CT scan done.  ROS  General: (-) fever, (-) chills, (-) night sweats, (-) weakness Skin: (-) rashes, (-) itching,. Eyes: (-) visual changes, (-) redness, (-) itching. Nose and Sinuses: (-) nasal stuffiness or itchiness, (-) postnasal drip, (-) nosebleeds, (-) sinus trouble. Mouth and Throat: (-) sore throat, (-) hoarseness. Neck: (-) swollen glands, (-) enlarged thyroid, (-) neck pain. Respiratory: - cough, (-) bloody sputum, + shortness of breath, - wheezing. Cardiovascular: - ankle swelling, (-) chest pain. Lymphatic: (-) lymph node enlargement. Neurologic: (-) numbness, (-) tingling. Psychiatric: (-) anxiety, (-)  depression   Current Medication: Outpatient Encounter Medications as of 01/26/2019  Medication Sig  . albuterol (PROVENTIL) (2.5 MG/3ML) 0.083% nebulizer solution Take 2.5 mg by nebulization every 4 (four) hours as needed for shortness of breath or cough.  . gabapentin (NEURONTIN) 300 MG capsule Take 300 mg by mouth daily.  Marland Kitchen loratadine (CLARITIN) 10 MG tablet Take 10 mg by mouth daily.  . Naproxen Sodium 220 MG CAPS Take 440 mg by mouth 2 (two) times daily.  . NON FORMULARY cpap device  . SYMBICORT 80-4.5 MCG/ACT inhaler INHALE 2 PUFFS TWO (2) TIMES A DAY.  Marland Kitchen venlafaxine (EFFEXOR) 75 MG tablet Take 75 mg by mouth daily.  Marland Kitchen venlafaxine XR (EFFEXOR-XR) 150 MG 24 hr capsule Take 150 mg by mouth daily.  . chlorthalidone (HYGROTON) 25 MG tablet Take 25 mg by mouth every morning.  . potassium chloride SA (K-DUR,KLOR-CON) 20 MEQ tablet Take 40 mEq by mouth daily.   . [DISCONTINUED] cetirizine (ZYRTEC) 10 MG tablet Take 10 mg by mouth daily.  . [DISCONTINUED] Fluticasone-Salmeterol (ADVAIR) 500-50 MCG/DOSE AEPB Inhale 1 puff into the lungs 2 (two) times daily.   No facility-administered encounter medications on file as of 01/26/2019.     Surgical History: History reviewed. No pertinent surgical history.  Medical History: Past Medical History:  Diagnosis Date  . COPD (chronic obstructive pulmonary disease) ([facility])   . COPD (chronic obstructive pulmonary disease) ([facility])   . Depression   . Emphysema lung ([facility])   . Heart disease   . Hypertension     Family History: Family History  Problem Relation Age of Onset  . COPD Mother   . Heart failure Mother   . CAD Father     Social History: Social History   Socioeconomic History  . Marital status: Single    Spouse name: Not on file  . Number of children: Not on file  . Years of education: Not on file  . Highest education level: Not on file  Occupational History  . Not on file  Social Needs  . Financial resource strain: Not on file   . Food insecurity    Worry: Not on file    Inability: Not on file  . Transportation needs    Medical: Not on file    Non-medical: Not on file  Tobacco Use  . Smoking status: Current Every Day Smoker    Types: Cigars  . Smokeless tobacco: Never Used  Substance and Sexual Activity  . Alcohol use: Never    Frequency: Never  . Drug use: Never  . Sexual activity: Not on file  Lifestyle  . Physical activity    Days per week: Not on file    Minutes per session: Not on file  . Stress: Not on file  Relationships  . Social Musicianconnections    Talks on phone: Not on file    Gets together: Not on file    Attends religious service: Not on file    Active member of club or organization: Not on file    Attends meetings of clubs or organizations: Not on file    Relationship status: Not on file  . Intimate partner violence    Fear of current or ex partner: Not on file    Emotionally abused: Not on file    Physically abused: Not on file    Forced sexual activity: Not on file  Other Topics Concern  . Not on file  Social History Narrative   With mother, independent at baseline    Vital Signs: Blood pressure 114/80, pulse 85, resp. rate 16, height 5\' 3"  (1.6 m), weight 201 lb (91.2 kg), SpO2 98 %.  Examination: General Appearance: The patient is well-developed, well-nourished, and in no distress. Skin: Gross inspection of skin unremarkable. Head: normocephalic, no gross deformities. Eyes: no gross deformities noted. ENT: ears appear grossly normal no exudates. Neck: Supple. No thyromegaly. No LAD. Respiratory: no rhonchi noted at this time. Cardiovascular: Normal S1 and S2 without murmur or rub. Extremities: No cyanosis. pulses are equal. Neurologic: Alert and oriented. No involuntary movements.  LABS: No results found for this or any previous visit (from the past 2160 hour(s)).  Radiology: Dg Chest 2 View  Result Date: 05/16/2018 CLINICAL DATA:  Left chest pain, shortness of  breath EXAM: CHEST - 2 VIEW COMPARISON:  None. FINDINGS: Lungs are clear.  No pleural effusion or pneumothorax. The heart is normal in size. Visualized osseous structures are within normal limits. IMPRESSION: Normal chest radiographs. Electronically Signed   By: Charline BillsSriyesh  Krishnan M.D.   On: 05/16/2018 00:20   Ct Angio Chest Pe W And/or Wo Contrast  Result Date: 05/16/2018 CLINICAL DATA:  58 year old with left chest pain. Shortness of breath. EXAM: CT ANGIOGRAPHY CHEST WITH CONTRAST TECHNIQUE: Multidetector CT imaging of the chest was performed using the standard protocol during bolus administration of intravenous contrast. Multiplanar CT image reconstructions and MIPs were obtained to evaluate the vascular anatomy. CONTRAST:  75mL OMNIPAQUE IOHEXOL 350 MG/ML SOLN COMPARISON:  None. FINDINGS: Cardiovascular: Satisfactory opacification of the pulmonary arteries to the segmental level. No evidence  of pulmonary embolism. Normal heart size. No pericardial effusion. Normal caliber of the thoracic aorta. Atherosclerotic calcifications at the aortic arch and origin of the great vessels. Mediastinum/Nodes: No significant mediastinal or hilar lymphadenopathy. Left hilar tissue measures roughly 0.9 cm in the short axis on sequence 5, image 127. No axillary lymph node enlargement. Esophagus is unremarkable. Lungs/Pleura: Trachea and mainstem bronchi are patent. Peripheral densities in the posterior right lower lobe best seen on sequence 6, image 64. Evidence for mild centrilobular emphysema in the upper lungs. 4 mm peripheral nodule in the right upper lobe on sequence 6 image 16. Peripheral irregular densities in the posterior left lower lobe near the superior segment, best seen on sequence 6, image 55. Focal thickening along the left major fissure on sequence 6, image 53 probably represents a subpleural lymph node. Subtle density in the right upper lobe near the right major fissure on sequence 6, image 33 is nonspecific.  Upper Abdomen: No acute abnormality in the upper abdomen. Musculoskeletal: No acute bone abnormality. Review of the MIP images confirms the above findings. IMPRESSION: 1. Negative for a pulmonary embolism. 2. Poorly defined peripheral opacities in both lower lobes, right side greater than left. Findings could be related to infectious or inflammatory process of unknown age. Acute process can not be excluded. Based on the underlying emphysema, recommend 3 month follow-up chest CT to ensure resolution of these densities. 3. Few scattered peripheral and pleural-based nodular densities, largest measuring roughly 4 mm. These small nodular densities are nonspecific. No follow-up needed if patient is low-risk (and has no known or suspected primary neoplasm). Non-contrast chest CT can be considered in 12 months if patient is high-risk. This recommendation follows the consensus statement: Guidelines for Management of Incidental Pulmonary Nodules Detected on CT Images: From the Fleischner Society 2017; Radiology 2017; 284:228-243. 4. Aortic Atherosclerosis (ICD10-I70.0) and Emphysema (ICD10-J43.9). Electronically Signed   By: Richarda Overlie M.D.   On: 05/16/2018 09:52    No results found.  No results found.    Assessment and Plan: Patient Active Problem List   Diagnosis Date Noted  . Pleurisy 05/16/2018    1. OSA patient is to continue with the CPAP at the current commended pressures.  She is doing well except for her mask leak.  She is to continue to work with her DME provider to try to adjust the mask to try to improve her leak as well as compliance. 2. COPD mild COPD based on the the last pulmonary function.  Patient needs to stop smoking.  She is using her inhalers as prescribed 3. Nicotine dependence once again smoking cessation counseling was provided 4. SOB secondary to above she states that she is using her inhalers as prescribed and does seem to be helping 5. Pulmonary nodule as noted above on the CT  scan follow-up CT scan ordered today 6. Morbid obesity dietary management discussed with exercise patient states she gets short of breath with exercise so we discussed the possibility of smoking cessation once again.  General Counseling: I have discussed the findings of the evaluation and examination with Jola Babinski.  I have also discussed any further diagnostic evaluation thatmay be needed or ordered today. Anaijah verbalizes understanding of the findings of todays visit. We also reviewed her medications today and discussed drug interactions and side effects including but not limited excessive drowsiness and altered mental states. We also discussed that there is always a risk not just to her but also people around her. she has been encouraged to  call the office with any questions or concerns that should arise related to todays visit.    Time spent: 25 minutes  I have personally obtained a history, examined the patient, evaluated laboratory and imaging results, formulated the assessment and plan and placed orders.    Allyne Gee, MD [facility] Pulmonary and Critical Care Sleep medicine

## 2019-01-26 NOTE — Patient Instructions (Signed)
CT Angiogram  A CT angiogram is a procedure to look at the blood vessels in various areas of the body. For this procedure, a large X-ray machine, called a CT scanner, takes detailed pictures of blood vessels that have been injected with a dye (contrast material). A CT angiogram allows your health care provider to see how well blood is flowing to the area of your body that is being checked. Your health care provider will be able to see if there are any problems, such as a blockage. Tell a health care provider about:  Any allergies you have.  All medicines you are taking, including vitamins, herbs, eye drops, creams, and over-the-counter medicines.  Any problems you or family members have had with anesthetic medicines.  Any blood disorders you have.  Any surgeries you have had.  Any medical conditions you have.  Whether you are pregnant or may be pregnant.  Whether you are breastfeeding.  Any anxiety disorders, chronic pain, or other conditions you have that may increase your stress or prevent you from lying still. What are the risks? Generally, this is a safe procedure. However, problems may occur, including:  Infection.  Bleeding.  Allergic reactions to medicines or dyes.  Damage to other structures or organs.  Kidney damage from the dye or contrast that is used.  Increased risk of cancer from radiation exposure. This risk is low. Talk with your health care provider about: ? The risks and benefits of testing. ? How you can receive the lowest dose of radiation. What happens before the procedure?  Wear comfortable clothing and remove any jewelry.  Follow instructions from your health care provider about eating and drinking. For most people, instructions may include these actions: ? For 12 hours before the test, avoid caffeine. This includes tea, coffee, soda, and energy drinks or pills. ? For 3-4 hours before the test, stop eating or drinking anything but water. ? Stay  well hydrated by continuing to drink water before the exam. This will help to clear the contrast dye from your body after the test.  Ask your health care provider about changing or stopping your regular medicines. This is especially important if you are taking diabetes medicines or blood thinners. What happens during the procedure?  An IV tube will be inserted into one of your veins.  You will be asked to lie on an exam table. This table will slide in and out of the CT machine during the procedure.  Contrast dye will be injected into the IV tube. You might feel warm, or you may get a metallic taste in your mouth.  The table that you are lying on will move into the CT machine tunnel for the scan.  The person running the machine will give you instructions while the scans are being done. You may be asked to: ? Keep your arms above your head. ? Hold your breath. ? Stay very still, even if the table is moving.  When the scanning is complete, you will be moved out of the machine.  The IV tube will be removed. The procedure may vary among health care providers and hospitals. What happens after the procedure?  You might feel warm, or you may get a metallic taste in your mouth.  You may be asked to drink water or other fluids to wash (flush) the contrast material out of your body.  It is up to you to get the results of your procedure. Ask your health care provider, or the department   that is doing the procedure, when your results will be ready. Summary  A CT angiogram is a procedure to look at the blood vessels in various areas of the body.  You will need to stay very still during the exam.  You may be asked to drink water or other fluids to wash (flush) the contrast material out of your body after your scan. This information is not intended to replace advice given to you by your health care provider. Make sure you discuss any questions you have with your health care provider. Document  Released: 12/19/2015 Document Revised: 06/30/2018 Document Reviewed: 12/19/2015 Elsevier Patient Education  2020 [employer].

## 2019-02-03 ENCOUNTER — Ambulatory Visit (INDEPENDENT_AMBULATORY_CARE_PROVIDER_SITE_OTHER): Payer: Medicare Other

## 2019-02-03 ENCOUNTER — Other Ambulatory Visit: Payer: Self-pay

## 2019-02-03 DIAGNOSIS — Z23 Encounter for immunization: Secondary | ICD-10-CM

## 2019-02-05 ENCOUNTER — Encounter: Payer: Self-pay | Admitting: Emergency Medicine

## 2019-02-05 ENCOUNTER — Ambulatory Visit
Admission: EM | Admit: 2019-02-05 | Discharge: 2019-02-05 | Disposition: A | Discharge status: Home / Self Care | Payer: Medicare Other

## 2019-02-05 ENCOUNTER — Other Ambulatory Visit: Payer: Self-pay

## 2019-02-05 DIAGNOSIS — M5412 Radiculopathy, cervical region: Secondary | ICD-10-CM

## 2019-02-05 DIAGNOSIS — I10 Essential (primary) hypertension: Secondary | ICD-10-CM | POA: Diagnosis not present

## 2019-02-05 MED ORDER — TIZANIDINE HCL 4 MG PO CAPS: 4.0000 mg | ORAL_CAPSULE | Freq: Three times a day (TID) | ORAL | 0 refills | Status: DC

## 2019-02-05 MED ORDER — MELOXICAM 15 MG PO TABS: 15.0000 mg | ORAL_TABLET | Freq: Every day | ORAL | 0 refills | Status: DC

## 2019-02-05 MED ORDER — KETOROLAC TROMETHAMINE 60 MG/2ML IM SOLN
60.0000 mg | Freq: Once | INTRAMUSCULAR | Status: AC
  Administered 2019-02-05: 60 mg via INTRAMUSCULAR

## 2019-02-05 NOTE — ED Triage Notes (Signed)
Patient c/o pain that started on the left side of her neck and goes down her left arm for a week.  Patient reports history of pinched nerve on the left side of her neck.  Patient denies chest pain or SOB.

## 2019-02-05 NOTE — ED Provider Notes (Signed)
[facility]    CSN: [id number] Arrival date & time: 02/05/19  1039      History   Chief Complaint Chief Complaint  Patient presents with  . Arm Pain    left  . Neck Pain    left side    HPI Jahzaria Vary is a 58 y.o. female.   HPI   58 year old female presents with left-sided neck pain that radiates into her arm extending into her ring and little fingers.  He has had this for about a week.  Does not remember any specific injury although she does watch her grandchildren including a 48-month-old which she has to lift frequently.  She has been diagnosed in the past with a pinched nerve in her neck and it will occasionally flare.  Denies any chest pain or shortness of breath.  She does not endorse numbness or tingling but just straight pain.  It does not interfere specifically with her sleep.  She has not noticed any decrease in strength.  She denies any bowel or bladder dysfunction.  The pain is worse with extension of her neck.  Has been taking Tylenol which helps somewhat but does not completely relieve the pain.         Past Medical History:  Diagnosis Date  . COPD (chronic obstructive pulmonary disease) (HCC)   . COPD (chronic obstructive pulmonary disease) (HCC)   . Depression   . Emphysema lung (HCC)   . Heart disease   . Hypertension     Patient Active Problem List   Diagnosis Date Noted  . Pleurisy 05/16/2018    History reviewed. No pertinent surgical history.  OB History   No obstetric history on file.      Home Medications    Prior to Admission medications   Medication Sig Start Date End Date Taking? Authorizing Provider  albuterol (PROVENTIL) (2.5 MG/3ML) 0.083% nebulizer solution Take 2.5 mg by nebulization every 4 (four) hours as needed for shortness of breath or cough. 04/29/18  Yes [provider]  chlorthalidone (HYGROTON) 25 MG tablet Take 25 mg by mouth every morning. 01/13/18 02/05/19 Yes [provider]   gabapentin (NEURONTIN) 300 MG capsule Take 300 mg by mouth daily. 01/13/18  Yes [provider]  ipratropium (ATROVENT HFA) 17 MCG/ACT inhaler Inhale into the lungs. 12/28/18  Yes [provider]  loratadine (CLARITIN) 10 MG tablet Take 10 mg by mouth daily.   Yes [provider]  potassium chloride SA (K-DUR,KLOR-CON) 20 MEQ tablet Take 40 mEq by mouth daily.  10/29/17 02/05/19 Yes [provider]  SYMBICORT 80-4.5 MCG/ACT inhaler INHALE 2 PUFFS TWO (2) TIMES A DAY. 12/08/18  Yes [provider]  venlafaxine (EFFEXOR) 75 MG tablet Take 75 mg by mouth daily. 10/28/17  Yes [provider]  venlafaxine XR (EFFEXOR-XR) 150 MG 24 hr capsule Take 150 mg by mouth daily. 10/28/17  Yes [provider]  meloxicam (MOBIC) 15 MG tablet Take 1 tablet (15 mg total) by mouth daily. 02/05/19   Lutricia Feil, PA-C  Naproxen Sodium 220 MG CAPS Take 440 mg by mouth 2 (two) times daily.    [provider]  NON FORMULARY cpap device    [provider]  tiZANidine (ZANAFLEX) 4 MG capsule Take 1 capsule (4 mg total) by mouth 3 (three) times daily. 02/05/19   Lutricia Feil, PA-C    Family History Family History  Problem Relation Age of Onset  . COPD Mother   . Heart  failure Mother   . CAD Father     Social History Social History   Tobacco Use  . Smoking status: Current Every Day Smoker    Types: Cigars  . Smokeless tobacco: Never Used  Substance Use Topics  . Alcohol use: Never    Frequency: Never  . Drug use: Never     Allergies   Mucinex [guaifenesin er]   Review of Systems Review of Systems  Constitutional: Positive for activity change. Negative for appetite change, chills, fatigue and fever.  Musculoskeletal: Positive for myalgias, neck pain and neck stiffness.  All other systems reviewed and are negative.    Physical Exam Triage Vital Signs ED Triage Vitals  Enc Vitals Group     BP 02/05/19 1049 (!)  142/118     Pulse Rate 02/05/19 1049 (!) 101     Resp 02/05/19 1049 16     Temp 02/05/19 1049 98.7 F (37.1 C)     Temp Source 02/05/19 1049 Oral     SpO2 02/05/19 1049 99 %     Weight 02/05/19 1047 200 lb (90.7 kg)     Height 02/05/19 1047 5\' 3"  (1.6 m)     Head Circumference --      Peak Flow --      Pain Score 02/05/19 1046 8     Pain Loc --      Pain Edu? --      Excl. in GC? --    No data found.  Updated Vital Signs BP 124/81 (BP Location: Right Arm)   Pulse 86   Temp 98.7 F (37.1 C) (Oral)   Resp 16   Ht 5\' 3"  (1.6 m)   Wt 200 lb (90.7 kg)   SpO2 99%   BMI 35.43 kg/m   Visual Acuity Right Eye Distance:   Left Eye Distance:   Bilateral Distance:    Right Eye Near:   Left Eye Near:    Bilateral Near:     Physical Exam Vitals signs and nursing note reviewed.  Constitutional:      General: She is not in acute distress.    Appearance: Normal appearance. She is obese. She is not ill-appearing, toxic-appearing or diaphoretic.  HENT:     Head: Normocephalic and atraumatic.  Eyes:     Conjunctiva/sclera: Conjunctivae normal.  Neck:     Musculoskeletal: Normal range of motion and neck supple. Muscular tenderness present.     Comments: Examination of the cervical spine shows the patient tending to have her neck into a right-sided list.  She has a good range of motion to flexion and rotation bilaterally but extension and particularly extension and left lateral flexion is very painful.  Upper extremity sensation is intact to light touch.  Upper extremity strength is intact resistance.  Deep tendon reflexes are 1+/4 but symmetrical. Cardiovascular:     Rate and Rhythm: Normal rate and regular rhythm.     Heart sounds: Normal heart sounds.  Pulmonary:     Effort: Pulmonary effort is normal.     Breath sounds: Normal breath sounds.  Musculoskeletal:        General: Tenderness present. No swelling or deformity.  Skin:    General: Skin is warm and dry.  Neurological:      General: No focal deficit present.     Mental Status: She is alert and oriented to person, place, and time.  Psychiatric:        Mood and Affect: Mood normal.  Behavior: Behavior normal.        Thought Content: Thought content normal.        Judgment: Judgment normal.      UC Treatments / Results  Labs (all labs ordered are listed, but only abnormal results are displayed) Labs Reviewed - No data to display  EKG   Radiology No results found.  Procedures Procedures (including critical care time)  Medications Ordered in UC Medications  ketorolac (TORADOL) injection 60 mg (60 mg Intramuscular Given 02/05/19 1142)    Initial Impression / Assessment and Plan / UC Course  I have reviewed the triage vital signs and the nursing notes.  Pertinent labs & imaging results that were available during my care of the patient were reviewed by me and considered in my medical decision making (see chart for details).   58 year old presents with left-sided neck pain with radiation into her left upper extremity in a C8 distribution.  She has a history of a pinched nerve in the past.  Physical exam does not show any alarming findings.  Offered her an injection of Toradol which she gladly accepted.  Told her she needs to avoid symptoms as much as possible.  She should exercise caution while lifting the 45-month-old child.  I will place her on Mobic that she will begin with her evening meals.  I have also provided her with Zanaflex that she may take 3 times a day with appropriate precautions.  Biofreeze may be beneficial for her.  She may also use a cervical collar at nighttime if she is finding it difficult to find a comfortable position but she should not wear it continuously.  If she is not improving in 1 week she should follow-up with her primary care physician   Final Clinical Impressions(s) / UC Diagnoses   Final diagnoses:  Cervical radiculopathy     Discharge Instructions      Avoid symptoms as much as possible.  Apply Biofreeze 3 times daily.  Take the meloxicam with your dinner meal daily.Use  Caution while taking muscle relaxers.  Do not perform activities requiring concentration or judgment and do not drive.  If you are not improving follow-up with your primary care physician.    ED Prescriptions    Medication Sig Dispense Auth. Provider   meloxicam (MOBIC) 15 MG tablet Take 1 tablet (15 mg total) by mouth daily. 30 tablet Crecencio Mc P, PA-C   tiZANidine (ZANAFLEX) 4 MG capsule Take 1 capsule (4 mg total) by mouth 3 (three) times daily. 21 capsule Lorin Picket, PA-C     PDMP not reviewed this encounter.   Lorin Picket, PA-C 02/05/19 1153

## 2019-02-05 NOTE — Discharge Instructions (Signed)
Avoid symptoms as much as possible.  Apply Biofreeze 3 times daily.  Take the meloxicam with your dinner meal daily.Use  Caution while taking muscle relaxers.  Do not perform activities requiring concentration or judgment and do not drive.  If you are not improving follow-up with your primary care physician.

## 2019-02-08 ENCOUNTER — Other Ambulatory Visit: Payer: Self-pay

## 2019-02-08 ENCOUNTER — Ambulatory Visit
Admission: RE | Admit: 2019-02-08 | Discharge: 2019-02-08 | Disposition: A | Discharge status: Home / Self Care | Payer: Medicare Other | Source: Ambulatory Visit | Attending: Internal Medicine | Admitting: Internal Medicine

## 2019-02-08 DIAGNOSIS — R918 Other nonspecific abnormal finding of lung field: Secondary | ICD-10-CM | POA: Insufficient documentation

## 2019-02-08 DIAGNOSIS — Z122 Encounter for screening for malignant neoplasm of respiratory organs: Secondary | ICD-10-CM | POA: Insufficient documentation

## 2019-02-08 DIAGNOSIS — I728 Aneurysm of other specified arteries: Secondary | ICD-10-CM | POA: Diagnosis not present

## 2019-02-08 DIAGNOSIS — J439 Emphysema, unspecified: Secondary | ICD-10-CM | POA: Insufficient documentation

## 2019-02-08 DIAGNOSIS — I700 Atherosclerosis of aorta: Secondary | ICD-10-CM | POA: Diagnosis not present

## 2019-02-08 DIAGNOSIS — Z87891 Personal history of nicotine dependence: Secondary | ICD-10-CM | POA: Diagnosis not present

## 2019-02-23 ENCOUNTER — Other Ambulatory Visit: Payer: Self-pay

## 2019-02-23 ENCOUNTER — Ambulatory Visit (INDEPENDENT_AMBULATORY_CARE_PROVIDER_SITE_OTHER): Payer: Medicare Other | Admitting: Internal Medicine

## 2019-02-23 ENCOUNTER — Encounter: Payer: Self-pay | Admitting: Internal Medicine

## 2019-02-23 VITALS — BP 122/82 | HR 92 | Temp 96.6°F | Resp 16 | Ht 63.0 in | Wt 203.0 lb

## 2019-02-23 DIAGNOSIS — Z87891 Personal history of nicotine dependence: Secondary | ICD-10-CM | POA: Diagnosis not present

## 2019-02-23 DIAGNOSIS — J449 Chronic obstructive pulmonary disease, unspecified: Secondary | ICD-10-CM | POA: Diagnosis not present

## 2019-02-23 DIAGNOSIS — R911 Solitary pulmonary nodule: Secondary | ICD-10-CM

## 2019-02-23 DIAGNOSIS — Z9989 Dependence on other enabling machines and devices: Secondary | ICD-10-CM

## 2019-02-23 DIAGNOSIS — G4733 Obstructive sleep apnea (adult) (pediatric): Secondary | ICD-10-CM

## 2019-02-23 NOTE — Patient Instructions (Signed)
Steps to Quit Smoking Smoking tobacco is the leading cause of preventable death. It can affect almost every organ in the body. Smoking puts you and people around you at risk for many serious, long-lasting (chronic) diseases. Quitting smoking can be hard, but it is one of the best things that you can do for your health. It is never too late to quit. How do I get ready to quit? When you decide to quit smoking, make a plan to help you succeed. Before you quit:  Pick a date to quit. Set a date within the next 2 weeks to give you time to prepare.  Write down the reasons why you are quitting. Keep this list in places where you will see it often.  Tell your family, friends, and co-workers that you are quitting. Their support is important.  Talk with your doctor about the choices that may help you quit.  Find out if your health insurance will pay for these treatments.  Know the people, places, things, and activities that make you want to smoke (triggers). Avoid them. What first steps can I take to quit smoking?  Throw away all cigarettes at home, at work, and in your car.  Throw away the things that you use when you smoke, such as ashtrays and lighters.  Clean your car. Make sure to empty the ashtray.  Clean your home, including curtains and carpets. What can I do to help me quit smoking? Talk with your doctor about taking medicines and seeing a counselor at the same time. You are more likely to succeed when you do both.  If you are pregnant or breastfeeding, talk with your doctor about counseling or other ways to quit smoking. Do not take medicine to help you quit smoking unless your doctor tells you to do so. To quit smoking: Quit right away  Quit smoking totally, instead of slowly cutting back on how much you smoke over a period of time.  Go to counseling. You are more likely to quit if you go to counseling sessions regularly. Take medicine You may take medicines to help you quit. Some  medicines need a prescription, and some you can buy over-the-counter. Some medicines may contain a drug called nicotine to replace the nicotine in cigarettes. Medicines may:  Help you to stop having the desire to smoke (cravings).  Help to stop the problems that come when you stop smoking (withdrawal symptoms). Your doctor may ask you to use:  Nicotine patches, gum, or lozenges.  Nicotine inhalers or sprays.  Non-nicotine medicine that is taken by mouth. Find resources Find resources and other ways to help you quit smoking and remain smoke-free after you quit. These resources are most helpful when you use them often. They include:  Online chats with a counselor.  Phone quitlines.  Printed self-help materials.  Support groups or group counseling.  Text messaging programs.  Mobile phone apps. Use apps on your mobile phone or tablet that can help you stick to your quit plan. There are many free apps for mobile phones and tablets as well as websites. Examples include Quit Guide from the CDC and smokefree.gov  What things can I do to make it easier to quit?   Talk to your family and friends. Ask them to support and encourage you.  Call a phone quitline (1-800-QUIT-NOW), reach out to support groups, or work with a counselor.  Ask people who smoke to not smoke around you.  Avoid places that make you want to smoke,   such as: ? Bars. ? Parties. ? Smoke-break areas at work.  Spend time with people who do not smoke.  Lower the stress in your life. Stress can make you want to smoke. Try these things to help your stress: ? Getting regular exercise. ? Doing deep-breathing exercises. ? Doing yoga. ? Meditating. ? Doing a body scan. To do this, close your eyes, focus on one area of your body at a time from head to toe. Notice which parts of your body are tense. Try to relax the muscles in those areas. How will I feel when I quit smoking? Day 1 to 3 weeks Within the first 24 hours,  you may start to have some problems that come from quitting tobacco. These problems are very bad 2-3 days after you quit, but they do not often last for more than 2-3 weeks. You may get these symptoms:  Mood swings.  Feeling restless, nervous, angry, or annoyed.  Trouble concentrating.  Dizziness.  Strong desire for high-sugar foods and nicotine.  Weight gain.  Trouble pooping (constipation).  Feeling like you may vomit (nausea).  Coughing or a sore throat.  Changes in how the medicines that you take for other issues work in your body.  Depression.  Trouble sleeping (insomnia). Week 3 and afterward After the first 2-3 weeks of quitting, you may start to notice more positive results, such as:  Better sense of smell and taste.  Less coughing and sore throat.  Slower heart rate.  Lower blood pressure.  Clearer skin.  Better breathing.  Fewer sick days. Quitting smoking can be hard. Do not give up if you fail the first time. Some people need to try a few times before they succeed. Do your best to stick to your quit plan, and talk with your doctor if you have any questions or concerns. Summary  Smoking tobacco is the leading cause of preventable death. Quitting smoking can be hard, but it is one of the best things that you can do for your health.  When you decide to quit smoking, make a plan to help you succeed.  Quit smoking right away, not slowly over a period of time.  When you start quitting, seek help from your doctor, family, or friends. This information is not intended to replace advice given to you by your health care provider. Make sure you discuss any questions you have with your health care provider. Document Released: 02/14/2009 Document Revised: 07/08/2018 Document Reviewed: 07/09/2018 Elsevier Patient Education  2020 [employer]  

## 2019-02-23 NOTE — Progress Notes (Signed)
[facility] [address] [place], [place] [address]  Pulmonary Sleep Medicine   Office Visit Note  Patient Name: Penny Dickson DOB: 1960-10-05 MRN [id number]  Date of Service: 02/23/2019  Complaints/HPI: She had a CT scan done which shows resolution of infiltrates. She had nodules which are unchanged. She smokes cigars daily. Patient denies smoking marijuana.  The patient states that she does have some shortness of breath when she exerts herself.  Also has an occasional cough.  States that she has gained some weight because of having been on steroids for arthritis.  As far as her sleep apnea is concerned she states that she is using CPAP as ordered.  She does not seem to gain as much benefit from Symbicort and Atrovent she does gain benefit from the albuterol.  I suggested that we could try using Trelegy.  ROS  General: (-) fever, (-) chills, (-) night sweats, (-) weakness Skin: (-) rashes, (-) itching,. Eyes: (-) visual changes, (-) redness, (-) itching. Nose and Sinuses: (-) nasal stuffiness or itchiness, (-) postnasal drip, (-) nosebleeds, (-) sinus trouble. Mouth and Throat: (-) sore throat, (-) hoarseness. Neck: (-) swollen glands, (-) enlarged thyroid, (-) neck pain. Respiratory: - cough, (-) bloody sputum, + shortness of breath, - wheezing. Cardiovascular: - ankle swelling, (-) chest pain. Lymphatic: (-) lymph node enlargement. Neurologic: (-) numbness, (-) tingling. Psychiatric: (-) anxiety, (-) depression   Current Medication: Outpatient Encounter Medications as of 02/23/2019  Medication Sig  . albuterol (PROVENTIL) (2.5 MG/3ML) 0.083% nebulizer solution Take 2.5 mg by nebulization every 4 (four) hours as needed for shortness of breath or cough.  . gabapentin (NEURONTIN) 300 MG capsule Take 300 mg by mouth daily.  Marland Kitchen. ipratropium (ATROVENT HFA) 17 MCG/ACT inhaler Inhale into the lungs.  Marland Kitchen. loratadine (CLARITIN) 10 MG tablet Take 10 mg by mouth daily.  .  meloxicam (MOBIC) 15 MG tablet Take 1 tablet (15 mg total) by mouth daily.  . Naproxen Sodium 220 MG CAPS Take 440 mg by mouth 2 (two) times daily.  . NON FORMULARY cpap device  . SYMBICORT 80-4.5 MCG/ACT inhaler INHALE 2 PUFFS TWO (2) TIMES A DAY.  Marland Kitchen. tiZANidine (ZANAFLEX) 4 MG capsule Take 1 capsule (4 mg total) by mouth 3 (three) times daily.  Marland Kitchen. venlafaxine (EFFEXOR) 75 MG tablet Take 75 mg by mouth daily.  Marland Kitchen. venlafaxine XR (EFFEXOR-XR) 150 MG 24 hr capsule Take 150 mg by mouth daily.  . chlorthalidone (HYGROTON) 25 MG tablet Take 25 mg by mouth every morning.  . potassium chloride SA (K-DUR,KLOR-CON) 20 MEQ tablet Take 40 mEq by mouth daily.    No facility-administered encounter medications on file as of 02/23/2019.     Surgical History: History reviewed. No pertinent surgical history.  Medical History: Past Medical History:  Diagnosis Date  . COPD (chronic obstructive pulmonary disease) (HCC)   . COPD (chronic obstructive pulmonary disease) (HCC)   . Depression   . Emphysema lung (HCC)   . Heart disease   . Hypertension     Family History: Family History  Problem Relation Age of Onset  . COPD Mother   . Heart failure Mother   . CAD Father     Social History: Social History   Socioeconomic History  . Marital status: Single    Spouse name: Not on file  . Number of children: Not on file  . Years of education: Not on file  . Highest education level: Not on file  Occupational History  . Not  on file  Social Needs  . Financial resource strain: Not on file  . Food insecurity    Worry: Not on file    Inability: Not on file  . Transportation needs    Medical: Not on file    Non-medical: Not on file  Tobacco Use  . Smoking status: Current Every Day Smoker    Types: Cigars  . Smokeless tobacco: Never Used  Substance and Sexual Activity  . Alcohol use: Never    Frequency: Never  . Drug use: Never  . Sexual activity: Not on file  Lifestyle  . Physical activity     Days per week: Not on file    Minutes per session: Not on file  . Stress: Not on file  Relationships  . Social Musician on phone: Not on file    Gets together: Not on file    Attends religious service: Not on file    Active member of club or organization: Not on file    Attends meetings of clubs or organizations: Not on file    Relationship status: Not on file  . Intimate partner violence    Fear of current or ex partner: Not on file    Emotionally abused: Not on file    Physically abused: Not on file    Forced sexual activity: Not on file  Other Topics Concern  . Not on file  Social History Narrative   With mother, independent at baseline    Vital Signs: Blood pressure 122/82, pulse 92, temperature (!) 96.6 F (35.9 C), resp. rate 16, height 5\' 3"  (1.6 m), weight 203 lb (92.1 kg), SpO2 97 %.  Examination: General Appearance: The patient is well-developed, well-nourished, and in no distress. Skin: Gross inspection of skin unremarkable. Head: normocephalic, no gross deformities. Eyes: no gross deformities noted. ENT: ears appear grossly normal no exudates. Neck: Supple. No thyromegaly. No LAD. Respiratory: no rhonchi noted. Cardiovascular: Normal S1 and S2 without murmur or rub. Extremities: No cyanosis. pulses are equal. Neurologic: Alert and oriented. No involuntary movements.  LABS: No results found for this or any previous visit (from the past 2160 hour(s)).  Radiology: Ct Chest Wo Contrast  Result Date: 02/09/2019 CLINICAL DATA:  58 year old female for follow-up of pulmonary nodules and peripheral opacities within both LOWER lobes on prior CT. EXAM: CT CHEST WITHOUT CONTRAST TECHNIQUE: Multidetector CT imaging of the chest was performed following the standard protocol without IV contrast. COMPARISON:  05/16/2018 CT FINDINGS: Cardiovascular: Heart size is normal. Mild coronary artery and aortic atherosclerotic calcifications are present. There is no  evidence of thoracic aortic aneurysm or pericardial effusion. Mediastinum/Nodes: No enlarged mediastinal or axillary lymph nodes. Thyroid gland, trachea, and esophagus demonstrate no significant findings. Lungs/Pleura: Peripheral ill-defined opacities within both LOWER lobes identified on the prior CT have resolved. Mild centrilobular and paraseptal emphysema again identified. Small nodular opacities (4 mm and less) along both major fissures and peripherally within the RIGHT lung are unchanged. No pulmonary mass, airspace disease, consolidation, pleural effusion or pneumothorax noted. Upper Abdomen: No acute abnormality. 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 Musculoskeletal: No acute or suspicious bony abnormalities. IMPRESSION: 1. Interval resolution of peripheral ill-defined opacities within both LOWER lobes since 05/16/2018. 2. Unchanged small pulmonary/subpleural nodules, all 4 mm and less. No follow-up needed if patient is low-risk (and has no known or suspected primary neoplasm). Non-contrast chest CT can be considered in 12 months if patient is high-risk. This  recommendation follows the consensus statement: Guidelines for Management of Incidental Pulmonary Nodules Detected on CT Images: From the Fleischner Society 2017; Radiology 2017; 284:228-243. 3. 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 4. Aortic Atherosclerosis (ICD10-I70.0) and Emphysema (ICD10-J43.9). Electronically Signed   By: Harmon Pier M.D.   On: 02/09/2019 10:10    No results found.  Ct Chest Wo Contrast  Result Date: 02/09/2019 CLINICAL DATA:  58 year old female for follow-up of pulmonary nodules and peripheral opacities within both LOWER lobes on prior CT. EXAM: CT CHEST WITHOUT CONTRAST TECHNIQUE: Multidetector CT imaging of the chest was performed following the standard protocol without IV contrast. COMPARISON:  05/16/2018 CT FINDINGS: Cardiovascular: Heart size is normal. Mild coronary artery and aortic  atherosclerotic calcifications are present. There is no evidence of thoracic aortic aneurysm or pericardial effusion. Mediastinum/Nodes: No enlarged mediastinal or axillary lymph nodes. Thyroid gland, trachea, and esophagus demonstrate no significant findings. Lungs/Pleura: Peripheral ill-defined opacities within both LOWER lobes identified on the prior CT have resolved. Mild centrilobular and paraseptal emphysema again identified. Small nodular opacities (4 mm and less) along both major fissures and peripherally within the RIGHT lung are unchanged. No pulmonary mass, airspace disease, consolidation, pleural effusion or pneumothorax noted. Upper Abdomen: No acute abnormality. 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 Musculoskeletal: No acute or suspicious bony abnormalities. IMPRESSION: 1. Interval resolution of peripheral ill-defined opacities within both LOWER lobes since 05/16/2018. 2. Unchanged small pulmonary/subpleural nodules, all 4 mm and less. No follow-up needed if patient is low-risk (and has no known or suspected primary neoplasm). Non-contrast chest CT can be considered in 12 months if patient is high-risk. This recommendation follows the consensus statement: Guidelines for Management of Incidental Pulmonary Nodules Detected on CT Images: From the Fleischner Society 2017; Radiology 2017; 284:228-243. 3. 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 4. Aortic Atherosclerosis (ICD10-I70.0) and Emphysema (ICD10-J43.9). Electronically Signed   By: Harmon Pier M.D.   On: 02/09/2019 10:10      Assessment and Plan: Patient Active Problem List   Diagnosis Date Noted  . Pleurisy 05/16/2018    1. COPD for samples of Trelegy 2 boxes to use 1 puff a day and also instructed her not to use the Atrovent and Symbicort while she was on the Trelegy.  She also will need to have pulmonary functions repeated the last ones that were done revealed FEV1 of 71%. 2. OSA encourage compliance with CPAP.   He also needs to work on weight loss and diet and exercise counseling provided. 3. Pulmonary infiltrates the CT scan shows resolution of the previously noted lower lobe infiltrate she still does have some nodules which are unchanged from January.  Because of the fact that she does smoke cigars and inhales recommended that we get a follow-up CT scan in about a year 4. Tobacco use smoking cessation counseling patient only smokes cigars not cigarettes  General Counseling: I have discussed the findings of the evaluation and examination with Jola Babinski.  I have also discussed any further diagnostic evaluation thatmay be needed or ordered today. Stepfanie verbalizes understanding of the findings of todays visit. We also reviewed her medications today and discussed drug interactions and side effects including but not limited excessive drowsiness and altered mental states. We also discussed that there is always a risk not just to her but also people around her. she has been encouraged to call the office with any questions or concerns that should arise related to todays visit.    Time spent:  Notes  I have personally obtained a history, examined the patient, evaluated laboratory and imaging results, formulated the assessment and plan and placed orders.    Allyne Gee, MD [facility] Pulmonary and Critical Care Sleep medicine

## 2019-03-15 ENCOUNTER — Other Ambulatory Visit: Payer: Self-pay

## 2019-03-15 MED ORDER — TRELEGY ELLIPTA 100-62.5-25 MCG/INH IN AEPB: 1.0000 | INHALATION_SPRAY | Freq: Every day | RESPIRATORY_TRACT | 3 refills | Status: DC

## 2019-07-11 ENCOUNTER — Other Ambulatory Visit: Payer: Self-pay | Admitting: Internal Medicine

## 2019-07-24 ENCOUNTER — Telehealth: Payer: Self-pay

## 2019-07-24 NOTE — Telephone Encounter (Signed)
Confirmed and screened pt appt for 07/26/19 

## 2019-07-26 ENCOUNTER — Ambulatory Visit (INDEPENDENT_AMBULATORY_CARE_PROVIDER_SITE_OTHER): Payer: Medicare Other

## 2019-07-26 ENCOUNTER — Other Ambulatory Visit: Payer: Self-pay

## 2019-07-26 DIAGNOSIS — G4733 Obstructive sleep apnea (adult) (pediatric): Secondary | ICD-10-CM | POA: Diagnosis not present

## 2019-07-26 NOTE — Progress Notes (Signed)
95 percentile pressure 8   95th percentile leak 50.3   apnea index 1.8 /hr  apnea-hypopnea index  2.5 /hr   total days used  >4 hr 81 days  total days used <4 hr 8 days  Total compliance 90 percent  She is doing really good.leak is coming from not shutting the cpap off when going to restroom in middle of the night.  No other problems or questions at this time.

## 2019-08-22 ENCOUNTER — Telehealth: Payer: Self-pay

## 2019-08-22 NOTE — Telephone Encounter (Signed)
Confirmed appointment on 08/24/2019 and screened for covid. klh 

## 2019-08-24 ENCOUNTER — Other Ambulatory Visit: Payer: Self-pay

## 2019-08-24 ENCOUNTER — Ambulatory Visit (INDEPENDENT_AMBULATORY_CARE_PROVIDER_SITE_OTHER): Payer: Medicare Other | Admitting: Internal Medicine

## 2019-08-24 ENCOUNTER — Encounter: Payer: Self-pay | Admitting: Internal Medicine

## 2019-08-24 VITALS — BP 141/83 | HR 87 | Temp 97.2°F | Resp 16 | Ht 63.0 in | Wt 210.0 lb

## 2019-08-24 DIAGNOSIS — G4733 Obstructive sleep apnea (adult) (pediatric): Secondary | ICD-10-CM

## 2019-08-24 DIAGNOSIS — Z6837 Body mass index (BMI) 37.0-37.9, adult: Secondary | ICD-10-CM | POA: Diagnosis not present

## 2019-08-24 DIAGNOSIS — J449 Chronic obstructive pulmonary disease, unspecified: Secondary | ICD-10-CM

## 2019-08-24 DIAGNOSIS — R0602 Shortness of breath: Secondary | ICD-10-CM | POA: Diagnosis not present

## 2019-08-24 DIAGNOSIS — F17200 Nicotine dependence, unspecified, uncomplicated: Secondary | ICD-10-CM

## 2019-08-24 DIAGNOSIS — Z9989 Dependence on other enabling machines and devices: Secondary | ICD-10-CM

## 2019-08-24 MED ORDER — TRELEGY ELLIPTA 100-62.5-25 MCG/INH IN AEPB: INHALATION_SPRAY | RESPIRATORY_TRACT | 3 refills | Status: DC

## 2019-08-24 NOTE — Progress Notes (Signed)
[facility] [address], [place] [address]  Pulmonary Sleep Medicine   Office Visit Note  Patient Name: Penny Dickson DOB: January 18, 1961 MRN [id number]  Date of Service: 08/24/2019  Complaints/HPI: Patient is here for pulmonary follow up. She has a history of OSA, Pulmonary nodules, an copd.  Overall she is doing well.  Pt reports good compliance with CPAP therapy. Cleaning machine by hand, and changing filters and tubing as directed. Denies headaches, sinus issues, palpitations, or hemoptysis. Her copd is well controlled with trelegy. She denies issues.      ROS  General: (-) fever, (-) chills, (-) night sweats, (-) weakness Skin: (-) rashes, (-) itching,. Eyes: (-) visual changes, (-) redness, (-) itching. Nose and Sinuses: (-) nasal stuffiness or itchiness, (-) postnasal drip, (-) nosebleeds, (-) sinus trouble. Mouth and Throat: (-) sore throat, (-) hoarseness. Neck: (-) swollen glands, (-) enlarged thyroid, (-) neck pain. Respiratory: - cough, (-) bloody sputum, - shortness of breath, - wheezing. Cardiovascular: - ankle swelling, (-) chest pain. Lymphatic: (-) lymph node enlargement. Neurologic: (-) numbness, (-) tingling. Psychiatric: (-) anxiety, (-) depression   Current Medication: Outpatient Encounter Medications as of 08/24/2019  Medication Sig  . albuterol (PROVENTIL) (2.5 MG/3ML) 0.083% nebulizer solution Take 2.5 mg by nebulization every 4 (four) hours as needed for shortness of breath or cough.  . gabapentin (NEURONTIN) 300 MG capsule Take 300 mg by mouth daily.  . Naproxen Sodium 220 MG CAPS Take 440 mg by mouth 2 (two) times daily.  . NON FORMULARY cpap device  . TRELEGY ELLIPTA 100-62.5-25 MCG/INH AEPB TAKE 1 PUFF BY MOUTH EVERY DAY  . venlafaxine (EFFEXOR) 75 MG tablet Take 75 mg by mouth daily.  Marland Kitchen venlafaxine XR (EFFEXOR-XR) 150 MG 24 hr capsule Take 150 mg by mouth daily.  . chlorthalidone (HYGROTON) 25 MG tablet Take 25 mg by mouth every  morning.  Marland Kitchen ipratropium (ATROVENT HFA) 17 MCG/ACT inhaler Inhale into the lungs.  Marland Kitchen loratadine (CLARITIN) 10 MG tablet Take 10 mg by mouth daily.  . meloxicam (MOBIC) 15 MG tablet Take 1 tablet (15 mg total) by mouth daily. (Patient not taking: Reported on 08/24/2019)  . potassium chloride SA (K-DUR,KLOR-CON) 20 MEQ tablet Take 40 mEq by mouth daily.   . SYMBICORT 80-4.5 MCG/ACT inhaler INHALE 2 PUFFS TWO (2) TIMES A DAY.  Marland Kitchen tiZANidine (ZANAFLEX) 4 MG capsule Take 1 capsule (4 mg total) by mouth 3 (three) times daily. (Patient not taking: Reported on 08/24/2019)   No facility-administered encounter medications on file as of 08/24/2019.    Surgical History: History reviewed. No pertinent surgical history.  Medical History: Past Medical History:  Diagnosis Date  . COPD (chronic obstructive pulmonary disease) (HCC)   . COPD (chronic obstructive pulmonary disease) (HCC)   . Depression   . Emphysema lung (HCC)   . Heart disease   . Hypertension     Family History: Family History  Problem Relation Age of Onset  . COPD Mother   . Heart failure Mother   . CAD Father     Social History: Social History   Socioeconomic History  . Marital status: Single    Spouse name: Not on file  . Number of children: Not on file  . Years of education: Not on file  . Highest education level: Not on file  Occupational History  . Not on file  Tobacco Use  . Smoking status: Current Every Day Smoker    Types: E-cigarettes  . Smokeless tobacco: Never  Used  Substance and Sexual Activity  . Alcohol use: Never  . Drug use: Never  . Sexual activity: Not on file  Other Topics Concern  . Not on file  Social History Narrative   With mother, independent at baseline   Social Determinants of Health   Financial Resource Strain:   . Difficulty of Paying Living Expenses:   Food Insecurity:   . Worried About Charity fundraiser in the Last Year:   . Arboriculturist in the Last Year:   Transportation  Needs:   . Film/video editor (Medical):   Marland Kitchen Lack of Transportation (Non-Medical):   Physical Activity:   . Days of Exercise per Week:   . Minutes of Exercise per Session:   Stress:   . Feeling of Stress :   Social Connections:   . Frequency of Communication with Friends and Family:   . Frequency of Social Gatherings with Friends and Family:   . Attends Religious Services:   . Active Member of Clubs or Organizations:   . Attends Archivist Meetings:   Marland Kitchen Marital Status:   Intimate Partner Violence:   . Fear of Current or Ex-Partner:   . Emotionally Abused:   Marland Kitchen Physically Abused:   . Sexually Abused:     Vital Signs: Blood pressure (!) 141/83, pulse 87, temperature (!) 97.2 F (36.2 C), resp. rate 16, height 5\' 3"  (1.6 m), weight 210 lb (95.3 kg), SpO2 93 %.  Examination: General Appearance: The patient is well-developed, well-nourished, and in no distress. Skin: Gross inspection of skin unremarkable. Head: normocephalic, no gross deformities. Eyes: no gross deformities noted. ENT: ears appear grossly normal no exudates. Neck: Supple. No thyromegaly. No LAD. Respiratory: clear bilaerally. Cardiovascular: Normal S1 and S2 without murmur or rub. Extremities: No cyanosis. pulses are equal. Neurologic: Alert and oriented. No involuntary movements.  LABS: No results found for this or any previous visit (from the past 2160 hour(s)).  Radiology: CT Chest Wo Contrast  Result Date: 02/09/2019 CLINICAL DATA:  59 year old female for follow-up of pulmonary nodules and peripheral opacities within both LOWER lobes on prior CT. EXAM: CT CHEST WITHOUT CONTRAST TECHNIQUE: Multidetector CT imaging of the chest was performed following the standard protocol without IV contrast. COMPARISON:  05/16/2018 CT FINDINGS: Cardiovascular: Heart size is normal. Mild coronary artery and aortic atherosclerotic calcifications are present. There is no evidence of thoracic aortic aneurysm or  pericardial effusion. Mediastinum/Nodes: No enlarged mediastinal or axillary lymph nodes. Thyroid gland, trachea, and esophagus demonstrate no significant findings. Lungs/Pleura: Peripheral ill-defined opacities within both LOWER lobes identified on the prior CT have resolved. Mild centrilobular and paraseptal emphysema again identified. Small nodular opacities (4 mm and less) along both major fissures and peripherally within the RIGHT lung are unchanged. No pulmonary mass, airspace disease, consolidation, pleural effusion or pneumothorax noted. Upper Abdomen: No acute abnormality. 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 Musculoskeletal: No acute or suspicious bony abnormalities. IMPRESSION: 1. Interval resolution of peripheral ill-defined opacities within both LOWER lobes since 05/16/2018. 2. Unchanged small pulmonary/subpleural nodules, all 4 mm and less. No follow-up needed if patient is low-risk (and has no known or suspected primary neoplasm). Non-contrast chest CT can be considered in 12 months if patient is high-risk. This recommendation follows the consensus statement: Guidelines for Management of Incidental Pulmonary Nodules Detected on CT Images: From the Fleischner Society 2017; Radiology 2017; 284:228-243. 3. 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 4. Aortic Atherosclerosis (ICD10-I70.0)  and Emphysema (ICD10-J43.9). Electronically Signed   By: Harmon Pier M.D.   On: 02/09/2019 10:10    No results found.  No results found.    Assessment and Plan: Patient Active Problem List   Diagnosis Date Noted  . Pleurisy 05/16/2018    1. OSA on CPAP Continue cpap therapy as directed. Good control of symptoms  2. Obstructive chronic bronchitis without exacerbation (HCC) Much improved with Trelegy,  - Fluticasone-Umeclidin-Vilant (TRELEGY ELLIPTA) 100-62.5-25 MCG/INH AEPB; TAKE 1 PUFF BY MOUTH EVERY DAY  Dispense: 60 each; Refill: 3  3. BMI 37.0-37.9, adult comorbididies  include copd, and htn  4. SOB (shortness of breath) FEV1 is 1.8, and 89% of pre-predicted value.  - Spirometry with Graph  5. Nicotine dependence with current use Smoking cessation counseling: 1. Pt acknowledges the risks of long term smoking, she will try to quite smoking. 2. Options for different medications including nicotine products, chewing gum, patch etc, Wellbutrin and Chantix is discussed 3. Goal and date of compete cessation is discussed 4. Total time spent in smoking cessation is 15 min.   General Counseling: I have discussed the findings of the evaluation and examination with Jola Babinski.  I have also discussed any further diagnostic evaluation thatmay be needed or ordered today. Charrie verbalizes understanding of the findings of todays visit. We also reviewed her medications today and discussed drug interactions and side effects including but not limited excessive drowsiness and altered mental states. We also discussed that there is always a risk not just to her but also people around her. she has been encouraged to call the office with any questions or concerns that should arise related to todays visit.  Orders Placed This Encounter  Procedures  . Spirometry with Graph    Order Specific Question:   Where should this test be performed?    Answer:   [facility]     Time spent: 30 This patient was seen by Blima Ledger AGNP-C in Collaboration with Dr. Freda Munro as a part of collaborative care agreement.   I have personally obtained a history, examined the patient, evaluated laboratory and imaging results, formulated the assessment and plan and placed orders.    Yevonne Pax, MD [facility] Pulmonary and Critical Care Sleep medicine

## 2020-01-24 ENCOUNTER — Ambulatory Visit: Payer: Medicare Other

## 2020-01-31 ENCOUNTER — Ambulatory Visit: Payer: Medicare Other

## 2020-02-20 ENCOUNTER — Other Ambulatory Visit: Payer: Self-pay

## 2020-02-20 ENCOUNTER — Encounter: Payer: Self-pay | Admitting: Internal Medicine

## 2020-02-20 ENCOUNTER — Ambulatory Visit (INDEPENDENT_AMBULATORY_CARE_PROVIDER_SITE_OTHER): Payer: Medicare Other | Admitting: Internal Medicine

## 2020-02-20 VITALS — BP 122/70 | HR 75 | Temp 97.5°F | Resp 16 | Ht 63.0 in | Wt 218.2 lb

## 2020-02-20 DIAGNOSIS — J4489 Other specified chronic obstructive pulmonary disease: Secondary | ICD-10-CM

## 2020-02-20 DIAGNOSIS — Z7189 Other specified counseling: Secondary | ICD-10-CM

## 2020-02-20 DIAGNOSIS — G4733 Obstructive sleep apnea (adult) (pediatric): Secondary | ICD-10-CM | POA: Diagnosis not present

## 2020-02-20 DIAGNOSIS — Z23 Encounter for immunization: Secondary | ICD-10-CM

## 2020-02-20 DIAGNOSIS — J449 Chronic obstructive pulmonary disease, unspecified: Secondary | ICD-10-CM

## 2020-02-20 DIAGNOSIS — R0602 Shortness of breath: Secondary | ICD-10-CM | POA: Diagnosis not present

## 2020-02-20 DIAGNOSIS — R918 Other nonspecific abnormal finding of lung field: Secondary | ICD-10-CM

## 2020-02-20 DIAGNOSIS — Z9989 Dependence on other enabling machines and devices: Secondary | ICD-10-CM

## 2020-02-20 MED ORDER — TRELEGY ELLIPTA 100-62.5-25 MCG/INH IN AEPB: INHALATION_SPRAY | RESPIRATORY_TRACT | 3 refills | Status: DC

## 2020-02-20 NOTE — Progress Notes (Signed)
[facility] [address] [place], [place] [address]  Pulmonary Sleep Medicine   Office Visit Note  Patient Name: Penny Dickson DOB: 1960-10-19 MRN [id number]  Date of Service: 02/23/2020  Complaints/HPI:  Patient is being seen for routine pulmonary follow-up Followed for COPD as well as OSA on CPAP Her CPAP compliance is 90%, has a high leak pressure but this is due to CPAP not being turned off when she takes it off to go to restroom, no complications from CPAP, denies dryness, congestion, or headaches COPD-symptoms well controlled, using trelegy daily s/p COVID, all symptoms have resolved at this time pulmonary nodules found on CT scan 02/08/2019--recommended follow-up in 1 year if high risk, patient continues to vape daily  ROS  General: (-) fever, (-) chills, (-) night sweats, (-) weakness Skin: (-) rashes, (-) itching,. Eyes: (-) visual changes, (-) redness, (-) itching. Nose and Sinuses: (-) nasal stuffiness or itchiness, (-) postnasal drip, (-) nosebleeds, (-) sinus trouble. Mouth and Throat: (-) sore throat, (-) hoarseness. Neck: (-) swollen glands, (-) enlarged thyroid, (-) neck pain. Respiratory: - cough, (-) bloody sputum, + shortness of breath, - wheezing. Cardiovascular: - ankle swelling, (-) chest pain. Lymphatic: (-) lymph node enlargement. Neurologic: (-) numbness, (-) tingling. Psychiatric: (-) anxiety, (-) depression   Current Medication: Outpatient Encounter Medications as of 02/20/2020  Medication Sig   albuterol (PROVENTIL) (2.5 MG/3ML) 0.083% nebulizer solution Take 2.5 mg by nebulization every 4 (four) hours as needed for shortness of breath or cough.   Fluticasone-Umeclidin-Vilant (TRELEGY ELLIPTA) 100-62.5-25 MCG/INH AEPB TAKE 1 PUFF BY MOUTH EVERY DAY   gabapentin (NEURONTIN) 300 MG capsule Take 300 mg by mouth daily.   loratadine (CLARITIN) 10 MG tablet Take 10 mg by mouth daily.   NON FORMULARY cpap device   venlafaxine  (EFFEXOR) 75 MG tablet Take 75 mg by mouth daily.   venlafaxine XR (EFFEXOR-XR) 150 MG 24 hr capsule Take 150 mg by mouth daily.   [DISCONTINUED] Fluticasone-Umeclidin-Vilant (TRELEGY ELLIPTA) 100-62.5-25 MCG/INH AEPB TAKE 1 PUFF BY MOUTH EVERY DAY   chlorthalidone (HYGROTON) 25 MG tablet Take 25 mg by mouth every morning.   potassium chloride SA (K-DUR,KLOR-CON) 20 MEQ tablet Take 40 mEq by mouth daily.    [DISCONTINUED] ipratropium (ATROVENT HFA) 17 MCG/ACT inhaler Inhale into the lungs. (Patient not taking: Reported on 02/20/2020)   [DISCONTINUED] meloxicam (MOBIC) 15 MG tablet Take 1 tablet (15 mg total) by mouth daily. (Patient not taking: Reported on 08/24/2019)   [DISCONTINUED] Naproxen Sodium 220 MG CAPS Take 440 mg by mouth 2 (two) times daily. (Patient not taking: Reported on 02/20/2020)   [DISCONTINUED] SYMBICORT 80-4.5 MCG/ACT inhaler INHALE 2 PUFFS TWO (2) TIMES A DAY. (Patient not taking: Reported on 02/20/2020)   [DISCONTINUED] tiZANidine (ZANAFLEX) 4 MG capsule Take 1 capsule (4 mg total) by mouth 3 (three) times daily. (Patient not taking: Reported on 08/24/2019)   No facility-administered encounter medications on file as of 02/20/2020.    Surgical History: History reviewed. No pertinent surgical history.  Medical History: Past Medical History:  Diagnosis Date   COPD (chronic obstructive pulmonary disease) (HCC)    COPD (chronic obstructive pulmonary disease) (HCC)    Depression    Emphysema lung (HCC)    Heart disease    Hypertension     Family History: Family History  Problem Relation Age of Onset   COPD Mother    Heart failure Mother    CAD Father     Social History: Social History   Socioeconomic  History   Marital status: Single    Spouse name: Not on file   Number of children: Not on file   Years of education: Not on file   Highest education level: Not on file  Occupational History   Not on file  Tobacco Use   Smoking status:  Current Every Day Smoker    Types: E-cigarettes   Smokeless tobacco: Never Used  Vaping Use   Vaping Use: Never used  Substance and Sexual Activity   Alcohol use: Never   Drug use: Never   Sexual activity: Not on file  Other Topics Concern   Not on file  Social History Narrative   With mother, independent at baseline   Social Determinants of Health   Financial Resource Strain:    Difficulty of Paying Living Expenses: Not on file  Food Insecurity:    Worried About Running Out of Food in the Last Year: Not on file   [employer] of Food in the Last Year: Not on file  Transportation Needs:    Lack of Transportation (Medical): Not on file   Lack of Transportation (Non-Medical): Not on file  Physical Activity:    Days of Exercise per Week: Not on file   Minutes of Exercise per Session: Not on file  Stress:    Feeling of Stress : Not on file  Social Connections:    Frequency of Communication with Friends and Family: Not on file   Frequency of Social Gatherings with Friends and Family: Not on file   Attends Religious Services: Not on file   Active Member of Clubs or Organizations: Not on file   Attends Banker Meetings: Not on file   Marital Status: Not on file  Intimate Partner Violence:    Fear of Current or Ex-Partner: Not on file   Emotionally Abused: Not on file   Physically Abused: Not on file   Sexually Abused: Not on file    Vital Signs: Blood pressure 122/70, pulse 75, temperature (!) 97.5 F (36.4 C), resp. rate 16, height 5\' 3"  (1.6 m), weight 218 lb 3.2 oz (99 kg), SpO2 96 %.  Examination: General Appearance: The patient is well-developed, well-nourished, and in no distress. Skin: Gross inspection of skin unremarkable. Head: normocephalic, no gross deformities. Eyes: no gross deformities noted. ENT: ears appear grossly normal no exudates. Neck: Supple. No thyromegaly. No LAD. Respiratory: Clear throughout, no rhonchi, wheezing  or rales noted. Cardiovascular: Normal S1 and S2 without murmur or rub. Extremities: No cyanosis. pulses are equal. Neurologic: Alert and oriented. No involuntary movements.  LABS: No results found for this or any previous visit (from the past 2160 hour(s)).  Radiology: CT Chest Wo Contrast  Result Date: 02/09/2019 CLINICAL DATA:  59 year old female for follow-up of pulmonary nodules and peripheral opacities within both LOWER lobes on prior CT. EXAM: CT CHEST WITHOUT CONTRAST TECHNIQUE: Multidetector CT imaging of the chest was performed following the standard protocol without IV contrast. COMPARISON:  05/16/2018 CT FINDINGS: Cardiovascular: Heart size is normal. Mild coronary artery and aortic atherosclerotic calcifications are present. There is no evidence of thoracic aortic aneurysm or pericardial effusion. Mediastinum/Nodes: No enlarged mediastinal or axillary lymph nodes. Thyroid gland, trachea, and esophagus demonstrate no significant findings. Lungs/Pleura: Peripheral ill-defined opacities within both LOWER lobes identified on the prior CT have resolved. Mild centrilobular and paraseptal emphysema again identified. Small nodular opacities (4 mm and less) along both major fissures and peripherally within the RIGHT lung are unchanged. No pulmonary mass, airspace  disease, consolidation, pleural effusion or pneumothorax noted. Upper Abdomen: No acute abnormality. 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 Musculoskeletal: No acute or suspicious bony abnormalities. IMPRESSION: 1. Interval resolution of peripheral ill-defined opacities within both LOWER lobes since 05/16/2018. 2. Unchanged small pulmonary/subpleural nodules, all 4 mm and less. No follow-up needed if patient is low-risk (and has no known or suspected primary neoplasm). Non-contrast chest CT can be considered in 12 months if patient is high-risk. This recommendation follows the consensus statement: Guidelines  for Management of Incidental Pulmonary Nodules Detected on CT Images: From the Fleischner Society 2017; Radiology 2017; 284:228-243. 3. 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 4. Aortic Atherosclerosis (ICD10-I70.0) and Emphysema (ICD10-J43.9). Electronically Signed   By: Harmon Pier M.D.   On: 02/09/2019 10:10    Assessment and Plan: Patient Active Problem List   Diagnosis Date Noted   Pleurisy 05/16/2018    1. OSA on CPAP Continue with current CPAP settings and continue to monitor through routine downloads  2. CPAP use counseling Discussed importance of adequate CPAP use as well as proper care and cleaning techniques of machine and all supplies.  3. Pulmonary nodules Will repeat chest CT due to ongoing use of vape placing her high risk and need for monitoring of known pulmonary nodules - CT Chest Wo Contrast; Future  4. Obstructive chronic bronchitis without exacerbation (HCC) Symptoms remain stable with use of Trelegy Scheduled for PFT in April, if symptoms progress may need to evaluate PFT sooner - Fluticasone-Umeclidin-Vilant (TRELEGY ELLIPTA) 100-62.5-25 MCG/INH AEPB; TAKE 1 PUFF BY MOUTH EVERY DAY  Dispense: 60 each; Refill: 3  5. SOB (shortness of breath) FEV1 2.0L, 101% of predicted value - Spirometry with Graph  6. Flu vaccine need - Flu Vaccine MDCK QUAD PF  General Counseling: I have discussed the findings of the evaluation and examination with Jola Babinski.  I have also discussed any further diagnostic evaluation thatmay be needed or ordered today. Diyana verbalizes understanding of the findings of todays visit. We also reviewed her medications today and discussed drug interactions and side effects including but not limited excessive drowsiness and altered mental states. We also discussed that there is always a risk not just to her but also people around her. she has been encouraged to call the office with any questions or concerns that should arise related to todays visit.  Orders  Placed This Encounter  Procedures   CT Chest Wo Contrast    Standing Status:   Future    Standing Expiration Date:   02/19/2021    Order Specific Question:   Is patient pregnant?    Answer:   No    Order Specific Question:   Preferred imaging location?    Answer:   [facility]   Flu Vaccine MDCK QUAD PF   Spirometry with Graph    Order Specific Question:   Where should this test be performed?    Answer:   [facility]    Order Specific Question:   Basic spirometry    Answer:   Yes     Time spent: 30  I have personally obtained a history, examined the patient, evaluated laboratory and imaging results, formulated the assessment and plan and placed orders. This patient was seen by Brent General AGNP-C in Collaboration with Dr. Freda Munro as a part of collaborative care agreement.    Yevonne Pax, MD [facility] Pulmonary and Critical Care Sleep medicine

## 2020-02-23 ENCOUNTER — Encounter: Payer: Self-pay | Admitting: Internal Medicine

## 2020-02-23 NOTE — Patient Instructions (Signed)

## 2020-03-04 ENCOUNTER — Ambulatory Visit
Admission: RE | Admit: 2020-03-04 | Discharge: 2020-03-04 | Disposition: A | Discharge status: Home / Self Care | Payer: Medicare Other | Source: Ambulatory Visit | Attending: Hospice and Palliative Medicine | Admitting: Hospice and Palliative Medicine

## 2020-03-04 ENCOUNTER — Other Ambulatory Visit: Payer: Self-pay

## 2020-03-04 DIAGNOSIS — R918 Other nonspecific abnormal finding of lung field: Secondary | ICD-10-CM | POA: Insufficient documentation

## 2020-04-02 ENCOUNTER — Ambulatory Visit: Payer: Medicare Other | Admitting: Internal Medicine

## 2020-04-26 IMAGING — CT CT ANGIO CHEST
2 of 6 series · 17 of 46 positions shown · IV contrast (APPLIED)
Comparison: None.

CLINICAL DATA: 57-year-old with left chest pain. Shortness of
breath.

EXAM:
CT ANGIOGRAPHY CHEST WITH CONTRAST
TECHNIQUE: Multidetector CT imaging of the chest was performed using the
standard protocol during bolus administration of intravenous
contrast. Multiplanar CT image reconstructions and MIPs were
obtained to evaluate the vascular anatomy.
CONTRAST:  75mL OMNIPAQUE IOHEXOL 350 MG/ML SOLN

[Series 5: thins · axial · 0.67mm/px · z∈[-832,-572]mm · 14 of 287 slices shown]
[im 13/287  lung]
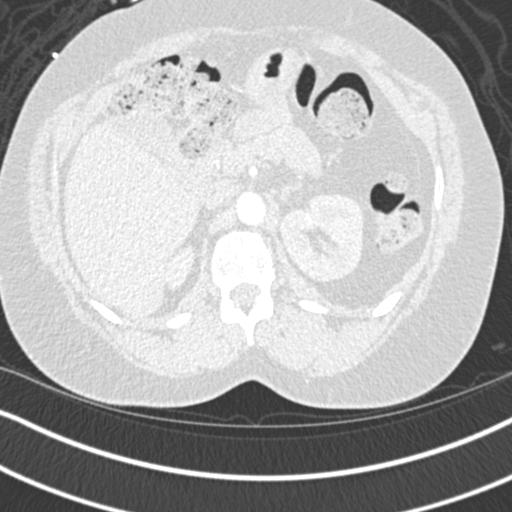
[im 38/287  soft-tissue]
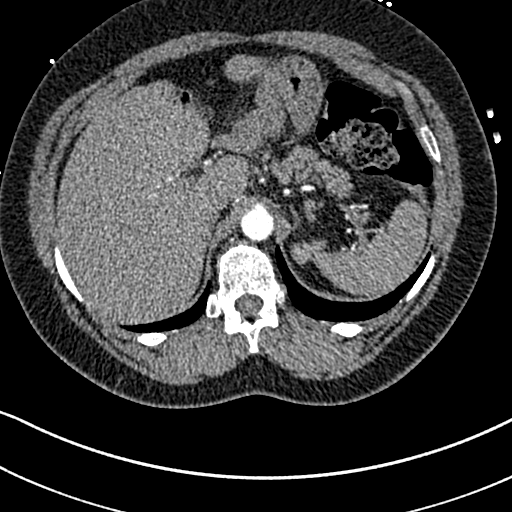
[im 50/287  lung]
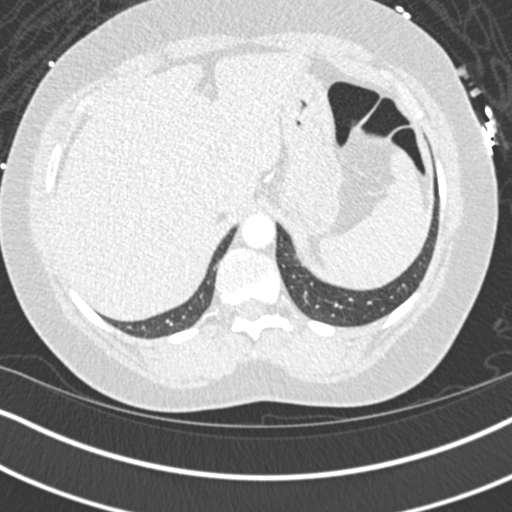
[im 75/287  soft-tissue]
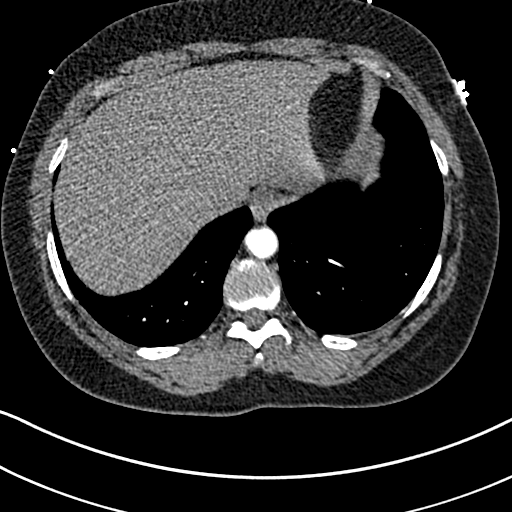
[im 100/287  lung]
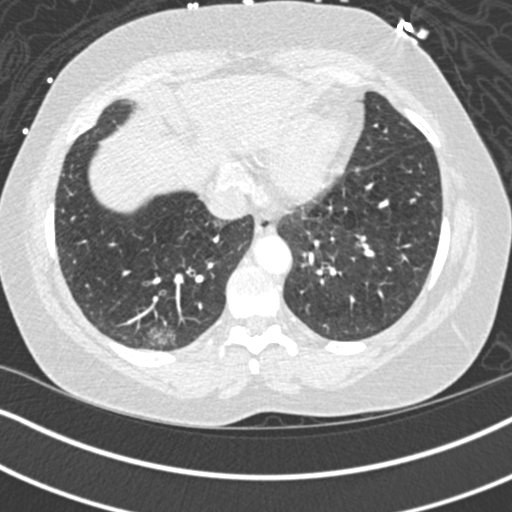
[im 112/287  soft-tissue]
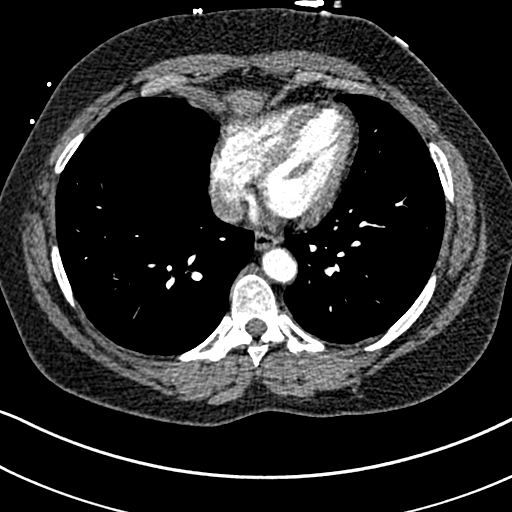
[im 137/287  lung]
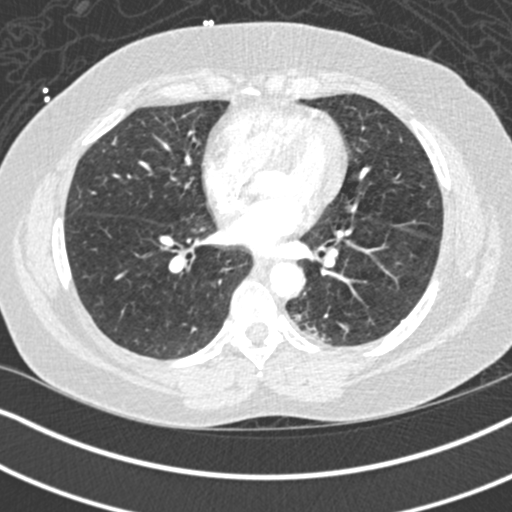
[im 150/287  soft-tissue]
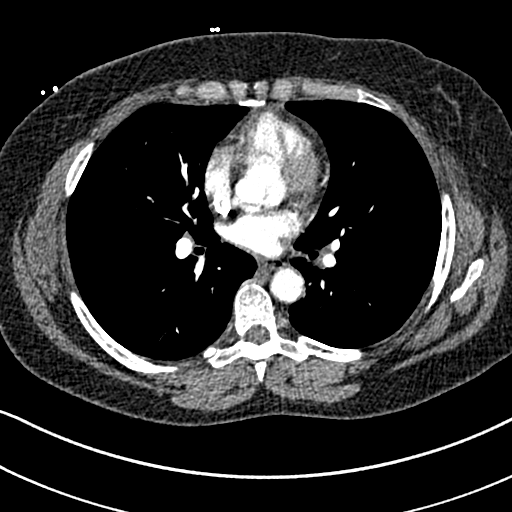
[im 175/287  lung]
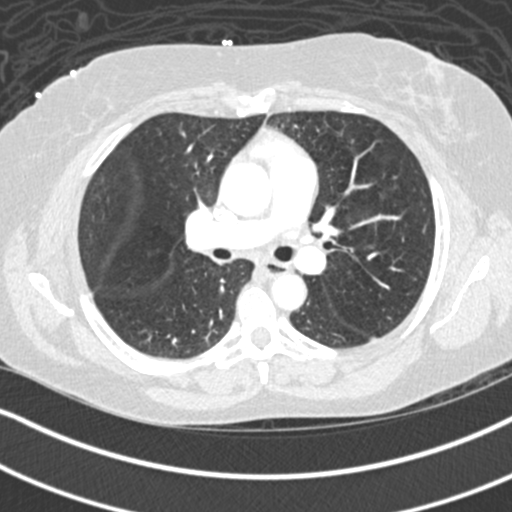
[im 187/287  soft-tissue]
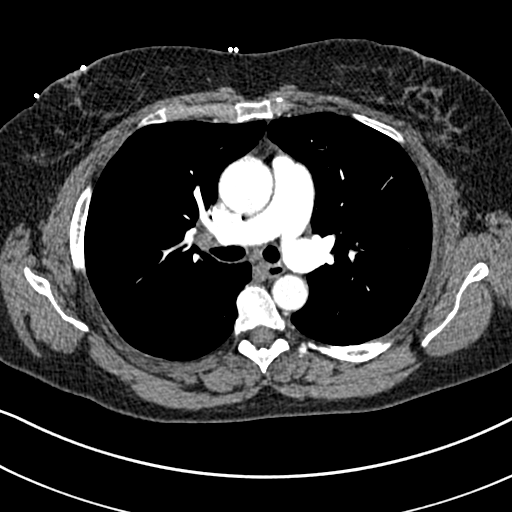
[im 212/287  lung]
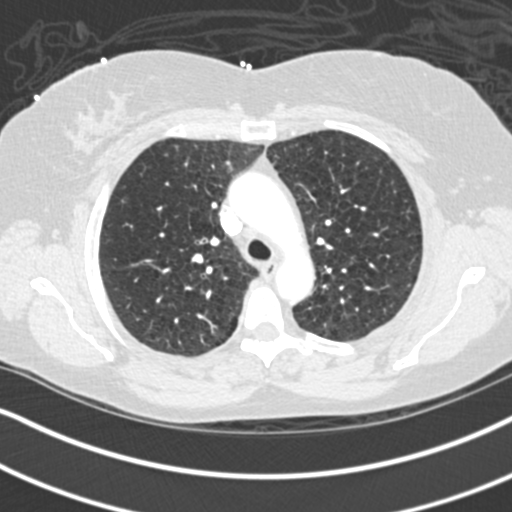
[im 237/287  soft-tissue]
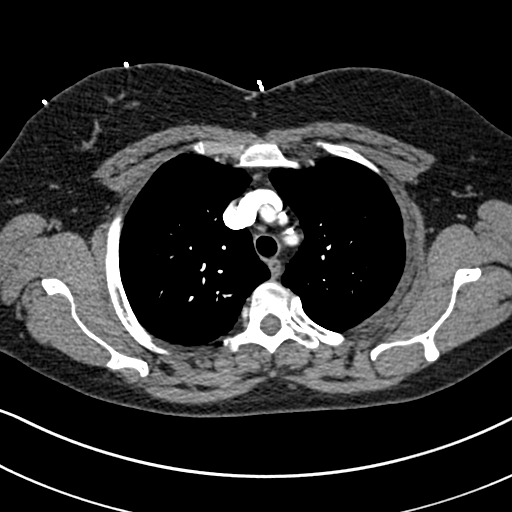
[im 249/287  lung]
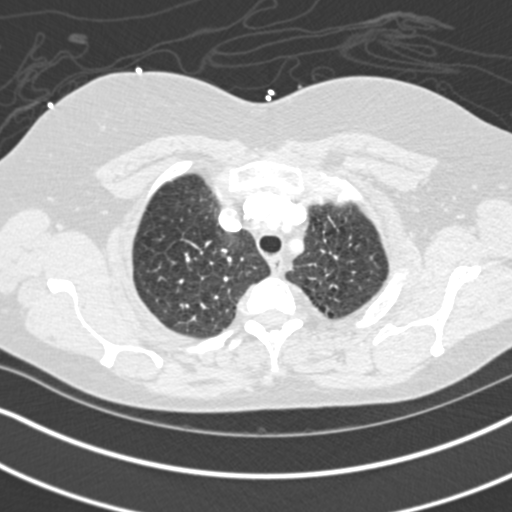
[im 274/287  soft-tissue]
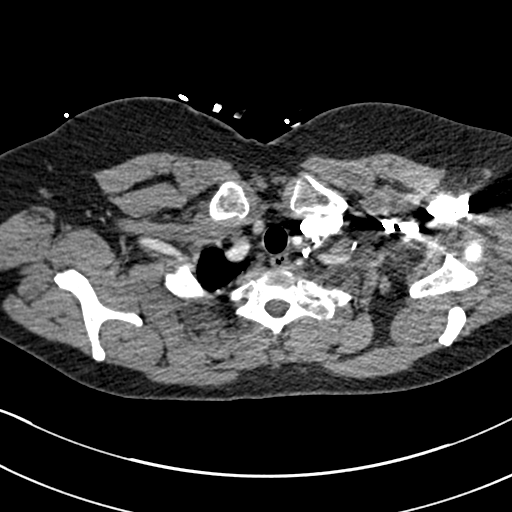

[Series 7: coronal mpr · coronal · 0.56mm/px · 3 of 90 slices shown]
[im 23/90  soft-tissue]
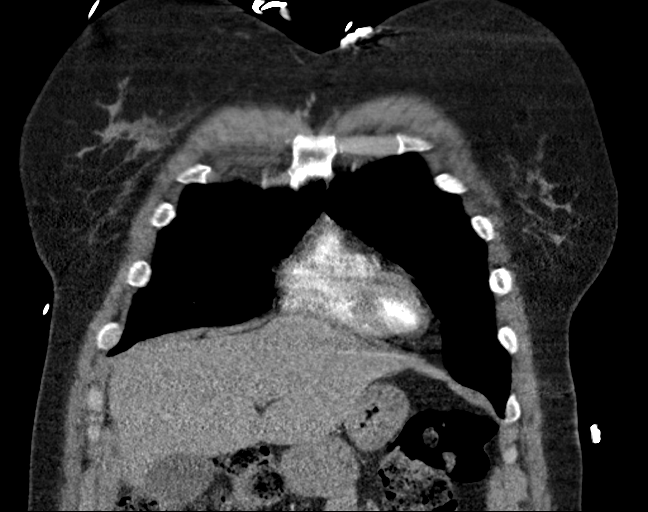
[im 45/90  soft-tissue]
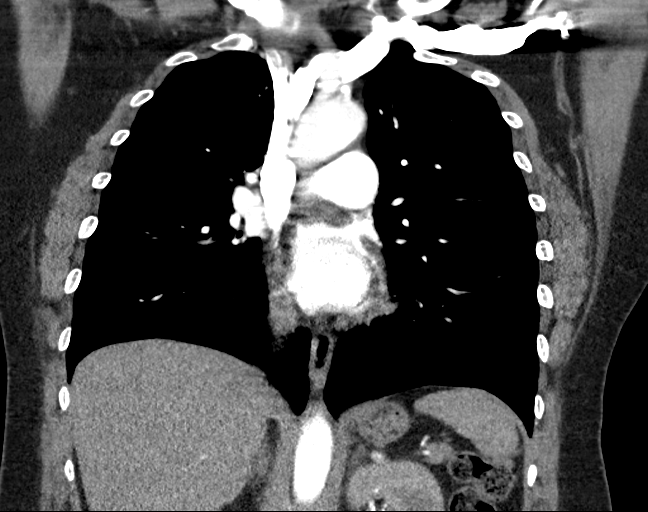
[im 67/90  soft-tissue]
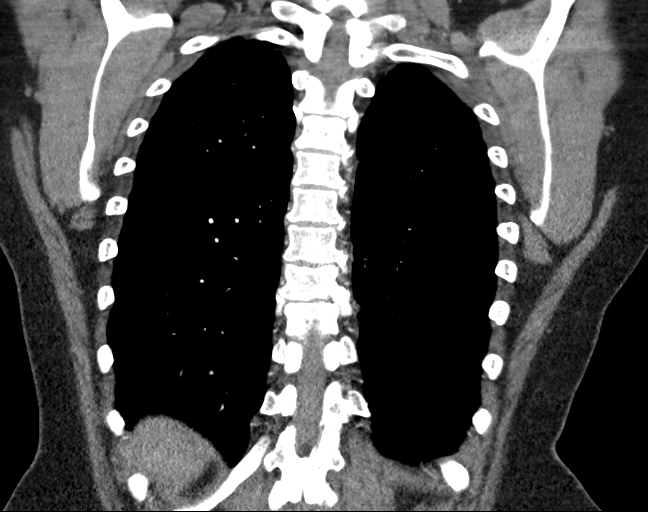

[17 of 46 positions shown; findings below may reference images not displayed]

FINDINGS: Cardiovascular: Satisfactory opacification of the pulmonary arteries
to the segmental level. No evidence of pulmonary embolism. Normal
heart size. No pericardial effusion. Normal caliber of the thoracic
aorta. Atherosclerotic calcifications at the aortic arch and origin
of the great vessels.

Mediastinum/Nodes: No significant mediastinal or hilar
lymphadenopathy. Left hilar tissue measures roughly 0.9 cm in the
short axis on sequence 5, image 127. No axillary lymph node
enlargement. Esophagus is unremarkable.

Lungs/Pleura: Trachea and mainstem bronchi are patent. Peripheral
densities in the posterior right lower lobe best seen on sequence 6,
image 64. Evidence for mild centrilobular emphysema in the upper
lungs. 4 mm peripheral nodule in the right upper lobe on sequence 6
image 16. Peripheral irregular densities in the posterior left lower
lobe near the superior segment, best seen on sequence 6, image 55.
Focal thickening along the left major fissure on sequence 6, image
53 probably represents a subpleural lymph node. Subtle density in
the right upper lobe near the right major fissure on sequence 6,
image 33 is nonspecific.

Upper Abdomen: No acute abnormality in the upper abdomen.

Musculoskeletal: No acute bone abnormality.

Review of the MIP images confirms the above findings.
IMPRESSION: 1. Negative for a pulmonary embolism.
2. Poorly defined peripheral opacities in both lower lobes, right
side greater than left. Findings could be related to infectious or
inflammatory process of unknown age. Acute process can not be
excluded. Based on the underlying emphysema, recommend 3 month
follow-up chest CT to ensure resolution of these densities.
3. Few scattered peripheral and pleural-based nodular densities,
largest measuring roughly 4 mm. These small nodular densities are
nonspecific. No follow-up needed if patient is low-risk (and has no
known or suspected primary neoplasm). Non-contrast chest CT can be
considered in 12 months if patient is high-risk. This recommendation
follows the consensus statement: Guidelines for Management of
Incidental Pulmonary Nodules Detected on CT Images: From the
4. Aortic Atherosclerosis (BK61B-GMY.Y) and Emphysema (BK61B-Y02.5).

## 2020-08-07 ENCOUNTER — Ambulatory Visit: Payer: Medicare Other

## 2020-08-14 ENCOUNTER — Ambulatory Visit: Payer: Medicare Other

## 2020-08-21 ENCOUNTER — Ambulatory Visit: Payer: Medicare Other | Admitting: Internal Medicine

## 2020-08-22 ENCOUNTER — Other Ambulatory Visit: Payer: Self-pay | Admitting: Hospice and Palliative Medicine

## 2020-08-22 ENCOUNTER — Other Ambulatory Visit: Payer: Self-pay

## 2020-08-22 DIAGNOSIS — J449 Chronic obstructive pulmonary disease, unspecified: Secondary | ICD-10-CM

## 2020-08-22 MED ORDER — TRELEGY ELLIPTA 100-62.5-25 MCG/INH IN AEPB: INHALATION_SPRAY | RESPIRATORY_TRACT | 1 refills | Status: DC

## 2020-08-26 ENCOUNTER — Other Ambulatory Visit: Payer: Self-pay | Admitting: Internal Medicine

## 2020-08-26 DIAGNOSIS — R0602 Shortness of breath: Secondary | ICD-10-CM

## 2020-08-26 DIAGNOSIS — R918 Other nonspecific abnormal finding of lung field: Secondary | ICD-10-CM

## 2020-08-29 ENCOUNTER — Ambulatory Visit: Payer: Medicare Other | Admitting: Hospice and Palliative Medicine

## 2020-09-04 ENCOUNTER — Ambulatory Visit: Payer: Medicare Other

## 2020-09-18 ENCOUNTER — Other Ambulatory Visit: Payer: Self-pay

## 2020-09-18 ENCOUNTER — Ambulatory Visit (INDEPENDENT_AMBULATORY_CARE_PROVIDER_SITE_OTHER): Payer: Medicare Other | Admitting: Internal Medicine

## 2020-09-18 DIAGNOSIS — R0602 Shortness of breath: Secondary | ICD-10-CM | POA: Diagnosis not present

## 2020-09-18 DIAGNOSIS — R918 Other nonspecific abnormal finding of lung field: Secondary | ICD-10-CM

## 2020-09-18 LAB — PULMONARY FUNCTION TEST

## 2020-10-03 HISTORY — PX: TYMPANOSTOMY TUBE PLACEMENT: SHX32

## 2020-10-06 NOTE — Procedures (Signed)
[facility] [address] [place], [address]    Complete Pulmonary Function Testing Interpretation:  FINDINGS:  Forced vital capacity is normal.  FEV1 is 1.54 L which is 75% of predicted and is mildly decreased.  FEV1 FVC ratio is moderately decreased.  Postbronchodilator there is no significant change in the FEV1 clinical improvement may occur in the absence of spirometric improvement.  Total lung capacity is increased residual volume is increased residual internal capacity ratio is increased FRC is increased.  DLCO is moderately decreased  IMPRESSION:  This pulmonary function study is consistent with a mild obstructive lung disease clinical correlation is recommended  Yevonne Pax, MD [facility] Pulmonary Critical Care Medicine Sleep Medicine

## 2020-10-25 ENCOUNTER — Telehealth: Payer: Self-pay

## 2020-10-25 NOTE — Telephone Encounter (Signed)
Left vm to screen for 10/28/20 appointment-Toni 

## 2020-10-28 ENCOUNTER — Encounter: Payer: Self-pay | Admitting: Internal Medicine

## 2020-10-28 ENCOUNTER — Ambulatory Visit (INDEPENDENT_AMBULATORY_CARE_PROVIDER_SITE_OTHER): Payer: Medicare Other | Admitting: Internal Medicine

## 2020-10-28 ENCOUNTER — Other Ambulatory Visit: Payer: Self-pay

## 2020-10-28 VITALS — BP 124/74 | HR 90 | Temp 97.4°F | Resp 16 | Ht 63.0 in | Wt 221.2 lb

## 2020-10-28 DIAGNOSIS — F17200 Nicotine dependence, unspecified, uncomplicated: Secondary | ICD-10-CM

## 2020-10-28 DIAGNOSIS — Z7189 Other specified counseling: Secondary | ICD-10-CM | POA: Diagnosis not present

## 2020-10-28 DIAGNOSIS — J449 Chronic obstructive pulmonary disease, unspecified: Secondary | ICD-10-CM | POA: Diagnosis not present

## 2020-10-28 DIAGNOSIS — G4733 Obstructive sleep apnea (adult) (pediatric): Secondary | ICD-10-CM

## 2020-10-28 NOTE — Patient Instructions (Signed)

## 2020-10-28 NOTE — Progress Notes (Signed)
[facility] [address] [place], [place] [address]  Pulmonary Sleep Medicine   Office Visit Note  Patient Name: Penny Dickson DOB: September 01, 1960 MRN [id number]  Date of Service: 10/28/2020  Complaints/HPI: COPD OSA. She has been doing Ok with her breathing. She had surgery on her nose in June this year so has not been on her CPAP device. She is waiting for permission from her doctor to go back on CPAP. She is using E cig and we discussed the importance of stopping this also. PFT done shows MILD COPD  ROS  General: (-) fever, (-) chills, (-) night sweats, (-) weakness Skin: (-) rashes, (-) itching,. Eyes: (-) visual changes, (-) redness, (-) itching. Nose and Sinuses: (-) nasal stuffiness or itchiness, (-) postnasal drip, (-) nosebleeds, (-) sinus trouble. Mouth and Throat: (-) sore throat, (-) hoarseness. Neck: (-) swollen glands, (-) enlarged thyroid, (-) neck pain. Respiratory: - cough, (-) bloody sputum, + shortness of breath, + wheezing. Cardiovascular: - ankle swelling, (-) chest pain. Lymphatic: (-) lymph node enlargement. Neurologic: (-) numbness, (-) tingling. Psychiatric: (-) anxiety, (-) depression   Current Medication: Outpatient Encounter Medications as of 10/28/2020  Medication Sig   albuterol (PROVENTIL) (2.5 MG/3ML) 0.083% nebulizer solution Take 2.5 mg by nebulization every 4 (four) hours as needed for shortness of breath or cough.   albuterol (VENTOLIN HFA) 108 (90 Base) MCG/ACT inhaler Inhale into the lungs.   Fluticasone-Umeclidin-Vilant (TRELEGY ELLIPTA) 100-62.5-25 MCG/INH AEPB INHALE 1 PUFF BY MOUTH EVERY DAY   gabapentin (NEURONTIN) 300 MG capsule Take 300 mg by mouth daily.   loratadine (CLARITIN) 10 MG tablet Take 10 mg by mouth daily.   NON FORMULARY cpap device   venlafaxine (EFFEXOR) 75 MG tablet Take 75 mg by mouth daily.   venlafaxine XR (EFFEXOR-XR) 150 MG 24 hr capsule Take 150 mg by mouth daily.   chlorthalidone (HYGROTON) 25 MG  tablet Take 25 mg by mouth every morning.   potassium chloride SA (K-DUR,KLOR-CON) 20 MEQ tablet Take 40 mEq by mouth daily.    No facility-administered encounter medications on file as of 10/28/2020.    Surgical History: Past Surgical History:  Procedure Laterality Date   TYMPANOSTOMY TUBE PLACEMENT Bilateral 10/03/2020    Medical History: Past Medical History:  Diagnosis Date   COPD (chronic obstructive pulmonary disease) (HCC)    COPD (chronic obstructive pulmonary disease) (HCC)    Depression    Emphysema lung (HCC)    Heart disease    Hypertension     Family History: Family History  Problem Relation Age of Onset   COPD Mother    Heart failure Mother    CAD Father     Social History: Social History   Socioeconomic History   Marital status: Single    Spouse name: Not on file   Number of children: Not on file   Years of education: Not on file   Highest education level: Not on file  Occupational History   Not on file  Tobacco Use   Smoking status: Every Day    Pack years: 0.00    Types: E-cigarettes   Smokeless tobacco: Never  Vaping Use   Vaping Use: Never used  Substance and Sexual Activity   Alcohol use: Never   Drug use: Never   Sexual activity: Not on file  Other Topics Concern   Not on file  Social History Narrative   With mother, independent at baseline   Social Determinants of Health   Financial Resource Strain: Not  on file  Food Insecurity: Not on file  Transportation Needs: Not on file  Physical Activity: Not on file  Stress: Not on file  Social Connections: Not on file  Intimate Partner Violence: Not on file    Vital Signs: Blood pressure 124/74, pulse 90, temperature (!) 97.4 F (36.3 C), resp. rate 16, height 5\' 3"  (1.6 m), weight 221 lb 3.2 oz (100.3 kg), SpO2 99 %.  Examination: General Appearance: The patient is well-developed, well-nourished, and in no distress. Skin: Gross inspection of skin unremarkable. Head:  normocephalic, no gross deformities. Eyes: no gross deformities noted. ENT: ears appear grossly normal no exudates. Neck: Supple. No thyromegaly. No LAD. Respiratory: no rhocnhi. Cardiovascular: Normal S1 and S2 without murmur or rub. Extremities: No cyanosis. pulses are equal. Neurologic: Alert and oriented. No involuntary movements.  LABS: No results found for this or any previous visit (from the past 2160 hour(s)).  Radiology: CT Chest Wo Contrast  Result Date: 03/04/2020 CLINICAL DATA:  Bilateral lower lobe lung nodules EXAM: CT CHEST WITHOUT CONTRAST TECHNIQUE: Multidetector CT imaging of the chest was performed following the standard protocol without IV contrast. COMPARISON:  02/08/2019, 05/16/2018 FINDINGS: Cardiovascular: Heart is unremarkable without pericardial effusion. Mild atherosclerosis of the thoracic aorta is unchanged. Mediastinum/Nodes: No enlarged mediastinal or axillary lymph nodes. Thyroid gland, trachea, and esophagus demonstrate no significant findings. Lungs/Pleura: 4 mm Peri fissural left lower lobe nodule best seen on image 77 of series 3, stable. 5 mm right lower lobe Peri fissural nodule image 63 of series 3 is also unchanged. No new pulmonary nodules or masses. No acute airspace disease, effusion, or pneumothorax. Central airways are patent. Upper Abdomen: No acute abnormality. 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 Musculoskeletal: No acute or destructive bony lesions. Reconstructed images demonstrate no additional findings. IMPRESSION: 1. Stable bilateral lower lobe subpleural less than 5 mm pulmonary nodules. Given the greater than 12 months of stability, no further imaging workup is warranted at this time. This recommendation follows the consensus statement: Guidelines for Management of Incidental Pulmonary Nodules Detected on CT Images: From the Fleischner Society 2017; Radiology 2017; 284:228-243. 2. 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 3.  Aortic Atherosclerosis  (ICD10-I70.0). Electronically Signed   By: 11-21-1987 M.D.   On: 03/04/2020 21:05    No results found.  No results found.    Assessment and Plan: Patient Active Problem List   Diagnosis Date Noted   Pleurisy 05/16/2018    1. OSA (obstructive sleep apnea) Will need to go back on CPAP once cleared by her provider  2. Obesity, morbid (HCC) Obesity Counseling: Had a lengthy discussion regarding patients BMI and weight issues. Patient was instructed on portion control as well as increased activity. Also discussed caloric restrictions with trying to maintain intake less than 2000 Kcal. Discussions were made in accordance with the 5As of weight management. Simple actions such as not eating late and if able to, taking a walk is suggested.   3. Obstructive chronic bronchitis without exacerbation (HCC) Smoking cessation needs to be considered. She is using e cig counseled on stoppping this  4. CPAP use counseling CPAP Counseling: had a lengthy discussion with the patient regarding the importance of PAP therapy in management of the sleep apnea. Patient appears to understand the risk factor reduction and also understands the risks associated with untreated sleep apnea. Patient will try to make a good faith effort to remain compliant with therapy. Also instructed the patient on proper cleaning of the device including the  water must be changed daily if possible and use of distilled water is preferred. Patient understands that the machine should be regularly cleaned with appropriate recommended cleaning solutions that do not damage the PAP machine for example given white vinegar and water rinses. Other methods such as ozone treatment may not be as good as these simple methods to achieve cleaning.   5. Nicotine dependence with current use Discussed methods of smoking cessation   General Counseling: I have discussed the findings of the evaluation and examination with Jola Babinski.  I have also discussed  any further diagnostic evaluation thatmay be needed or ordered today. Mishaal verbalizes understanding of the findings of todays visit. We also reviewed her medications today and discussed drug interactions and side effects including but not limited excessive drowsiness and altered mental states. We also discussed that there is always a risk not just to her but also people around her. she has been encouraged to call the office with any questions or concerns that should arise related to todays visit.  No orders of the defined types were placed in this encounter.    Time spent: 75  I have personally obtained a history, examined the patient, evaluated laboratory and imaging results, formulated the assessment and plan and placed orders.    Yevonne Pax, MD [facility] Pulmonary and Critical Care Sleep medicine

## 2020-11-06 ENCOUNTER — Ambulatory Visit: Payer: Medicare Other

## 2021-04-08 ENCOUNTER — Other Ambulatory Visit: Payer: Self-pay

## 2021-04-08 MED ORDER — TRELEGY ELLIPTA 100-62.5-25 MCG/ACT IN AEPB: 1.0000 | INHALATION_SPRAY | Freq: Every day | RESPIRATORY_TRACT | 1 refills | Status: DC

## 2021-05-12 ENCOUNTER — Ambulatory Visit: Payer: Medicare Other | Admitting: Internal Medicine

## 2021-05-12 ENCOUNTER — Other Ambulatory Visit: Payer: Self-pay

## 2021-05-26 ENCOUNTER — Encounter: Payer: Self-pay | Admitting: Internal Medicine

## 2021-05-26 ENCOUNTER — Other Ambulatory Visit: Payer: Self-pay

## 2021-05-26 ENCOUNTER — Ambulatory Visit (INDEPENDENT_AMBULATORY_CARE_PROVIDER_SITE_OTHER): Payer: Medicare Other | Admitting: Internal Medicine

## 2021-05-26 VITALS — BP 126/78 | HR 82 | Temp 98.3°F | Resp 16 | Ht 63.0 in | Wt 221.8 lb

## 2021-05-26 DIAGNOSIS — F17200 Nicotine dependence, unspecified, uncomplicated: Secondary | ICD-10-CM

## 2021-05-26 DIAGNOSIS — R0602 Shortness of breath: Secondary | ICD-10-CM

## 2021-05-26 DIAGNOSIS — Z7189 Other specified counseling: Secondary | ICD-10-CM | POA: Diagnosis not present

## 2021-05-26 DIAGNOSIS — G4733 Obstructive sleep apnea (adult) (pediatric): Secondary | ICD-10-CM | POA: Diagnosis not present

## 2021-05-26 NOTE — Patient Instructions (Signed)

## 2021-05-26 NOTE — Progress Notes (Signed)
[facility] [address] [place], [place] [id number]  Pulmonary Sleep Medicine   Office Visit Note  Patient Name: Penny SenateMarilyn Hail DOB: 13-Jul-1960 MRN [id number]  Date of Service: 05/26/2021  Complaints/HPI: She had PFT done which shows FEV1 75%. States that she still has SOB with exertion and has noted difficulty in losing weight. She also has chronic pain and has not been able to exercise.  Patient denies having any chest pain no cough noted no congestion is noted.  As above she does have difficulty with her weight management she needs to continue to work on exercise attempts and try to work on weight loss and dietary portion control.  As far as obstructive sleep apnea is concerned encouraged to be compliant with recommended therapy.  The patient has been doing well with using CPAP  ROS  General: (-) fever, (-) chills, (-) night sweats, (-) weakness Skin: (-) rashes, (-) itching,. Eyes: (-) visual changes, (-) redness, (-) itching. Nose and Sinuses: (-) nasal stuffiness or itchiness, (-) postnasal drip, (-) nosebleeds, (-) sinus trouble. Mouth and Throat: (-) sore throat, (-) hoarseness. Neck: (-) swollen glands, (-) enlarged thyroid, (-) neck pain. Respiratory: + cough, (-) bloody sputum, + shortness of breath, - wheezing. Cardiovascular: - ankle swelling, (-) chest pain. Lymphatic: (-) lymph node enlargement. Neurologic: (-) numbness, (-) tingling. Psychiatric: (-) anxiety, (-) depression   Current Medication: Outpatient Encounter Medications as of 05/26/2021  Medication Sig   albuterol (PROVENTIL) (2.5 MG/3ML) 0.083% nebulizer solution Take 2.5 mg by nebulization every 4 (four) hours as needed for shortness of breath or cough.   albuterol (VENTOLIN HFA) 108 (90 Base) MCG/ACT inhaler Inhale into the lungs.   Fluticasone-Umeclidin-Vilant (TRELEGY ELLIPTA) 100-62.5-25 MCG/ACT AEPB Inhale 1 puff into the lungs daily.   gabapentin (NEURONTIN) 300 MG capsule Take 300 mg  by mouth daily.   loratadine (CLARITIN) 10 MG tablet Take 10 mg by mouth daily.   NON FORMULARY cpap device   venlafaxine (EFFEXOR) 75 MG tablet Take 75 mg by mouth daily.   venlafaxine XR (EFFEXOR-XR) 150 MG 24 hr capsule Take 150 mg by mouth daily.   chlorthalidone (HYGROTON) 25 MG tablet Take 25 mg by mouth every morning.   potassium chloride SA (K-DUR,KLOR-CON) 20 MEQ tablet Take 40 mEq by mouth daily.    No facility-administered encounter medications on file as of 05/26/2021.    Surgical History: Past Surgical History:  Procedure Laterality Date   TYMPANOSTOMY TUBE PLACEMENT Bilateral 10/03/2020    Medical History: Past Medical History:  Diagnosis Date   COPD (chronic obstructive pulmonary disease) (HCC)    COPD (chronic obstructive pulmonary disease) (HCC)    Depression    Emphysema lung (HCC)    Heart disease    Hypertension     Family History: Family History  Problem Relation Age of Onset   COPD Mother    Heart failure Mother    CAD Father     Social History: Social History   Socioeconomic History   Marital status: Single    Spouse name: Not on file   Number of children: Not on file   Years of education: Not on file   Highest education level: Not on file  Occupational History   Not on file  Tobacco Use   Smoking status: Every Day    Types: E-cigarettes   Smokeless tobacco: Never  Vaping Use   Vaping Use: Never used  Substance and Sexual Activity   Alcohol use: Never   Drug use:  Never   Sexual activity: Not on file  Other Topics Concern   Not on file  Social History Narrative   With mother, independent at baseline   Social Determinants of Health   Financial Resource Strain: Not on file  Food Insecurity: Not on file  Transportation Needs: Not on file  Physical Activity: Not on file  Stress: Not on file  Social Connections: Not on file  Intimate Partner Violence: Not on file    Vital Signs: Blood pressure 126/78, pulse 82, temperature 98.3  F (36.8 C), resp. rate 16, height 5\' 3"  (1.6 m), weight 221 lb 12.8 oz (100.6 kg), SpO2 96 %.  Examination: General Appearance: The patient is well-developed, well-nourished, and in no distress. Skin: Gross inspection of skin unremarkable. Head: normocephalic, no gross deformities. Eyes: no gross deformities noted. ENT: ears appear grossly normal no exudates. Neck: Supple. No thyromegaly. No LAD. Respiratory: no rhonchi. Cardiovascular: Normal S1 and S2 without murmur or rub. Extremities: No cyanosis. pulses are equal. Neurologic: Alert and oriented. No involuntary movements.  LABS: No results found for this or any previous visit (from the past 2160 hour(s)).  Radiology: CT Chest Wo Contrast  Result Date: 03/04/2020 CLINICAL DATA:  Bilateral lower lobe lung nodules EXAM: CT CHEST WITHOUT CONTRAST TECHNIQUE: Multidetector CT imaging of the chest was performed following the standard protocol without IV contrast. COMPARISON:  02/08/2019, 05/16/2018 FINDINGS: Cardiovascular: Heart is unremarkable without pericardial effusion. Mild atherosclerosis of the thoracic aorta is unchanged. Mediastinum/Nodes: No enlarged mediastinal or axillary lymph nodes. Thyroid gland, trachea, and esophagus demonstrate no significant findings. Lungs/Pleura: 4 mm Peri fissural left lower lobe nodule best seen on image 77 of series 3, stable. 5 mm right lower lobe Peri fissural nodule image 63 of series 3 is also unchanged. No new pulmonary nodules or masses. No acute airspace disease, effusion, or pneumothorax. Central airways are patent. Upper Abdomen: No acute abnormality. 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 Musculoskeletal: No acute or destructive bony lesions. Reconstructed images demonstrate no additional findings. IMPRESSION: 1. Stable bilateral lower lobe subpleural less than 5 mm pulmonary nodules. Given the greater than 12 months of stability, no further imaging workup is warranted at this time. This  recommendation follows the consensus statement: Guidelines for Management of Incidental Pulmonary Nodules Detected on CT Images: From the Fleischner Society 2017; Radiology 2017; 284:228-243. 2. 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 3.  Aortic Atherosclerosis (ICD10-I70.0). Electronically Signed   By: 11-21[birth year]1987 M.D.   On: 03/04/2020 21:05    No results found.  No results found.    Assessment and Plan: Patient Active Problem List   Diagnosis Date Noted   Pleurisy 05/16/2018    1. SOB (shortness of breath) Work on exercise weight loss I think this will help significantly as far as the breathing is concerned - Spirometry with Graph  2. OSA (obstructive sleep apnea) CPAP use as discussed.    3. Obesity, morbid (HCC) Obesity Counseling: Had a lengthy discussion regarding patients BMI and weight issues. Patient was instructed on portion control as well as increased activity. Also discussed caloric restrictions with trying to maintain intake less than 2000 Kcal. Discussions were made in accordance with the 5As of weight management. Simple actions such as not eating late and if able to, taking a walk is suggested.   4. Nicotine dependence with current use Smoking cessation instruction/counseling given:  counseled patient on the dangers of tobacco use, advised patient to stop smoking, and reviewed strategies to maximize success  5. CPAP use counseling CPAP Counseling: had a lengthy discussion with the patient regarding the importance of PAP therapy in management of the sleep apnea. Patient appears to understand the risk factor reduction and also understands the risks associated with untreated sleep apnea. Patient will try to make a good faith effort to remain compliant with therapy. Also instructed the patient on proper cleaning of the device including the water must be changed daily if possible and use of distilled water is preferred. Patient understands that the machine should be  regularly cleaned with appropriate recommended cleaning solutions that do not damage the PAP machine for example given white vinegar and water rinses. Other methods such as ozone treatment may not be as good as these simple methods to achieve cleaning.    General Counseling: I have discussed the findings of the evaluation and examination with Jola Babinski.  I have also discussed any further diagnostic evaluation thatmay be needed or ordered today. Arleta verbalizes understanding of the findings of todays visit. We also reviewed her medications today and discussed drug interactions and side effects including but not limited excessive drowsiness and altered mental states. We also discussed that there is always a risk not just to her but also people around her. she has been encouraged to call the office with any questions or concerns that should arise related to todays visit.  Orders Placed This Encounter  Procedures   Spirometry with Graph    Order Specific Question:   Where should this test be performed?    Answer:   [facility]    Order Specific Question:   Basic spirometry    Answer:   Yes     Time spent: 38  I have personally obtained a history, examined the patient, evaluated laboratory and imaging results, formulated the assessment and plan and placed orders.    Yevonne Pax, MD [facility] Pulmonary and Critical Care Sleep medicine

## 2021-06-24 ENCOUNTER — Other Ambulatory Visit: Payer: Self-pay | Admitting: Internal Medicine

## 2021-08-20 ENCOUNTER — Other Ambulatory Visit: Payer: Self-pay | Admitting: Internal Medicine

## 2021-11-05 ENCOUNTER — Other Ambulatory Visit: Payer: Self-pay | Admitting: Internal Medicine

## 2021-11-25 ENCOUNTER — Ambulatory Visit: Payer: Medicare Other | Admitting: Internal Medicine

## 2022-01-04 ENCOUNTER — Other Ambulatory Visit: Payer: Self-pay | Admitting: Internal Medicine

## 2022-03-14 ENCOUNTER — Other Ambulatory Visit: Payer: Self-pay | Admitting: Internal Medicine

## 2022-05-20 ENCOUNTER — Other Ambulatory Visit: Payer: Self-pay | Admitting: Internal Medicine

## 2022-05-20 NOTE — Telephone Encounter (Signed)
Pt need appt for refills  ?

## 2022-06-04 ENCOUNTER — Other Ambulatory Visit: Payer: Self-pay | Admitting: Internal Medicine

## 2022-08-07 ENCOUNTER — Other Ambulatory Visit: Payer: Self-pay | Admitting: Internal Medicine

## 2022-08-07 ENCOUNTER — Telehealth: Payer: Self-pay | Admitting: Internal Medicine

## 2022-08-07 NOTE — Telephone Encounter (Signed)
Pt NO LONGER see DSK, she see's a pulmonology doctor in [place], she DOES NOT need med refilled-nm

## 2023-05-31 LAB — COLOGUARD: COLOGUARD: NEGATIVE

## 2024-04-27 ENCOUNTER — Emergency Department

## 2024-04-27 ENCOUNTER — Other Ambulatory Visit: Payer: Self-pay

## 2024-04-27 ENCOUNTER — Observation Stay
Admission: EM | Admit: 2024-04-27 | Discharge: 2024-04-28 | Disposition: A | Discharge status: Home / Self Care | Attending: Obstetrics and Gynecology | Admitting: Obstetrics and Gynecology

## 2024-04-27 DIAGNOSIS — R0602 Shortness of breath: Secondary | ICD-10-CM | POA: Diagnosis present

## 2024-04-27 DIAGNOSIS — F329 Major depressive disorder, single episode, unspecified: Secondary | ICD-10-CM | POA: Insufficient documentation

## 2024-04-27 DIAGNOSIS — J9601 Acute respiratory failure with hypoxia: Secondary | ICD-10-CM | POA: Diagnosis not present

## 2024-04-27 DIAGNOSIS — J441 Chronic obstructive pulmonary disease with (acute) exacerbation: Principal | ICD-10-CM | POA: Insufficient documentation

## 2024-04-27 DIAGNOSIS — F17290 Nicotine dependence, other tobacco product, uncomplicated: Secondary | ICD-10-CM | POA: Insufficient documentation

## 2024-04-27 DIAGNOSIS — R000 Tachycardia, unspecified: Secondary | ICD-10-CM | POA: Diagnosis not present

## 2024-04-27 DIAGNOSIS — I10 Essential (primary) hypertension: Secondary | ICD-10-CM | POA: Diagnosis not present

## 2024-04-27 DIAGNOSIS — Z720 Tobacco use: Secondary | ICD-10-CM

## 2024-04-27 DIAGNOSIS — G629 Polyneuropathy, unspecified: Secondary | ICD-10-CM | POA: Diagnosis not present

## 2024-04-27 DIAGNOSIS — E669 Obesity, unspecified: Secondary | ICD-10-CM | POA: Insufficient documentation

## 2024-04-27 DIAGNOSIS — Z79899 Other long term (current) drug therapy: Secondary | ICD-10-CM | POA: Insufficient documentation

## 2024-04-27 LAB — BASIC METABOLIC PANEL WITH GFR
Anion gap: 12 (ref 5–15)
BUN: 13 mg/dL (ref 8–23)
CO2: 27 mmol/L (ref 22–32)
Calcium: 8.9 mg/dL (ref 8.9–10.3)
Chloride: 97 mmol/L — ABNORMAL LOW (ref 98–111)
Creatinine, Ser: 1.17 mg/dL — ABNORMAL HIGH (ref 0.44–1.00)
GFR, Estimated: 52 mL/min — ABNORMAL LOW
Glucose, Bld: 112 mg/dL — ABNORMAL HIGH (ref 70–99)
Potassium: 3.5 mmol/L (ref 3.5–5.1)
Sodium: 137 mmol/L (ref 135–145)

## 2024-04-27 LAB — CBC
HCT: 45.1 % (ref 36.0–46.0)
Hemoglobin: 15.4 g/dL — ABNORMAL HIGH (ref 12.0–15.0)
MCH: 30.3 pg (ref 26.0–34.0)
MCHC: 34.1 g/dL (ref 30.0–36.0)
MCV: 88.8 fL (ref 80.0–100.0)
Platelets: 236 K/uL (ref 150–400)
RBC: 5.08 MIL/uL (ref 3.87–5.11)
RDW: 12.1 % (ref 11.5–15.5)
WBC: 5 K/uL (ref 4.0–10.5)
nRBC: 0 % (ref 0.0–0.2)

## 2024-04-27 LAB — PRO BRAIN NATRIURETIC PEPTIDE: Pro Brain Natriuretic Peptide: 105 pg/mL

## 2024-04-27 LAB — RESP PANEL BY RT-PCR (RSV, FLU A&B, COVID)  RVPGX2
Influenza A by PCR: NEGATIVE
Influenza B by PCR: NEGATIVE
Resp Syncytial Virus by PCR: NEGATIVE
SARS Coronavirus 2 by RT PCR: NEGATIVE

## 2024-04-27 LAB — TROPONIN T, HIGH SENSITIVITY: Troponin T High Sensitivity: <15 ng/L (ref 0–19)

## 2024-04-27 MED ORDER — HYDROCOD POLI-CHLORPHE POLI ER 10-8 MG/5ML PO SUER: 5.0000 mL | Freq: Two times a day (BID) | ORAL | Status: DC | PRN

## 2024-04-27 MED ORDER — ACETAMINOPHEN 650 MG RE SUPP: 650.0000 mg | Freq: Four times a day (QID) | RECTAL | Status: DC | PRN

## 2024-04-27 MED ORDER — ONDANSETRON HCL 4 MG/2ML IJ SOLN: 4.0000 mg | Freq: Four times a day (QID) | INTRAMUSCULAR | Status: DC | PRN

## 2024-04-27 MED ORDER — SODIUM CHLORIDE 0.9 % IV SOLN: INTRAVENOUS | Status: DC

## 2024-04-27 MED ORDER — VENLAFAXINE HCL ER 75 MG PO CP24
225.0000 mg | ORAL_CAPSULE | Freq: Every day | ORAL | Status: DC
  Administered 2024-04-28: 225 mg via ORAL
  Filled 2024-04-27: qty 3

## 2024-04-27 MED ORDER — PREDNISONE 20 MG PO TABS: 40.0000 mg | ORAL_TABLET | Freq: Every day | ORAL | Status: DC

## 2024-04-27 MED ORDER — ONDANSETRON HCL 4 MG PO TABS: 4.0000 mg | ORAL_TABLET | Freq: Four times a day (QID) | ORAL | Status: DC | PRN

## 2024-04-27 MED ORDER — POTASSIUM CHLORIDE CRYS ER 20 MEQ PO TBCR
40.0000 meq | EXTENDED_RELEASE_TABLET | Freq: Every day | ORAL | Status: DC
  Administered 2024-04-28: 40 meq via ORAL
  Filled 2024-04-27: qty 2

## 2024-04-27 MED ORDER — GABAPENTIN 300 MG PO CAPS
300.0000 mg | ORAL_CAPSULE | Freq: Every day | ORAL | Status: DC
  Administered 2024-04-27: 300 mg via ORAL
  Filled 2024-04-27: qty 1

## 2024-04-27 MED ORDER — METHYLPREDNISOLONE SODIUM SUCC 125 MG IJ SOLR
125.0000 mg | Freq: Once | INTRAMUSCULAR | Status: AC
  Administered 2024-04-27: 125 mg via INTRAVENOUS
  Filled 2024-04-27: qty 2

## 2024-04-27 MED ORDER — IPRATROPIUM-ALBUTEROL 0.5-2.5 (3) MG/3ML IN SOLN
3.0000 mL | Freq: Once | RESPIRATORY_TRACT | Status: AC
  Administered 2024-04-27: 3 mL via RESPIRATORY_TRACT
  Filled 2024-04-27: qty 3

## 2024-04-27 MED ORDER — ACETAMINOPHEN 325 MG PO TABS: 650.0000 mg | ORAL_TABLET | Freq: Four times a day (QID) | ORAL | Status: DC | PRN

## 2024-04-27 MED ORDER — CHLORTHALIDONE 25 MG PO TABS
25.0000 mg | ORAL_TABLET | Freq: Every day | ORAL | Status: DC
  Administered 2024-04-28: 25 mg via ORAL
  Filled 2024-04-27: qty 1

## 2024-04-27 MED ORDER — MAGNESIUM HYDROXIDE 400 MG/5ML PO SUSP: 30.0000 mL | Freq: Every day | ORAL | Status: DC | PRN

## 2024-04-27 MED ORDER — POTASSIUM CHLORIDE 20 MEQ PO PACK: 40.0000 meq | PACK | Freq: Once | ORAL | Status: DC

## 2024-04-27 MED ORDER — SODIUM CHLORIDE 0.9 % IV SOLN
1.0000 g | INTRAVENOUS | Status: DC
  Administered 2024-04-27: 1 g via INTRAVENOUS
  Filled 2024-04-27 (×3): qty 10

## 2024-04-27 MED ORDER — TRAZODONE HCL 50 MG PO TABS: 25.0000 mg | ORAL_TABLET | Freq: Every evening | ORAL | Status: DC | PRN

## 2024-04-27 MED ORDER — METHYLPREDNISOLONE SODIUM SUCC 40 MG IJ SOLR
40.0000 mg | Freq: Two times a day (BID) | INTRAMUSCULAR | Status: DC
  Administered 2024-04-28: 40 mg via INTRAVENOUS
  Filled 2024-04-27: qty 1

## 2024-04-27 MED ORDER — ENOXAPARIN SODIUM 60 MG/0.6ML IJ SOSY
45.0000 mg | PREFILLED_SYRINGE | INTRAMUSCULAR | Status: DC
  Administered 2024-04-28: 45 mg via SUBCUTANEOUS
  Filled 2024-04-27: qty 0.6

## 2024-04-27 NOTE — ED Provider Notes (Signed)
 "  [facility] Provider Note    Event Date/Time   First MD Initiated Contact with Patient 04/27/24 [phone]     (approximate)   History   Shortness of Breath   HPI  Penny Dickson is a 63 y.o. female history of COPD presenting today with concern of shortness of breath.  She states earlier in the week developed a cough with whitish sputum, did not think too much of it, yesterday started having some increased shortness of breath, she states she could not find her rescue inhaler generally it helps with the symptoms.  Symptoms continue to worsen was not able to breathe if she walked a few steps.  No affiliated chest pain has not had any leg swelling.  She denies any history of cardiac disease no prior history of PEs or DVTs.  Generally does not use any oxygen at baseline and apparently at home oxygen saturation was in the 80s.     Physical Exam   Triage Vital Signs: ED Triage Vitals [04/27/24 1332]  Encounter Vitals Group     BP 126/80     Girls Systolic BP Percentile      Girls Diastolic BP Percentile      Boys Systolic BP Percentile      Boys Diastolic BP Percentile      Pulse Rate (!) 106     Resp (!) 24     Temp 98.3 F (36.8 C)     Temp Source Oral     SpO2 (S) (!) 88 %     Weight      Height      Head Circumference      Peak Flow      Pain Score 0     Pain Loc      Pain Education      Exclude from Growth Chart     Most recent vital signs: Vitals:   04/27/24 1925 04/27/24 1952  BP:  139/84  Pulse: (!) 109 (!) 102  Resp:  20  Temp:  99 F (37.2 C)  SpO2: 92% 94%     General: Awake, no distress.  CV:  Good peripheral perfusion.  No extremity swelling Resp:  Normal effort.  Diffuse wheezes in the lower lobes, is able to speak in full sentences Abd:  No distention.  Soft nontender Other:     ED Results / Procedures / Treatments   Labs (all labs ordered are listed, but only abnormal results are displayed) Labs Reviewed  BASIC  METABOLIC PANEL WITH GFR - Abnormal; Notable for the following components:      Result Value   Chloride 97 (*)    Glucose, Bld 112 (*)    Creatinine, Ser 1.17 (*)    GFR, Estimated 52 (*)    All other components within normal limits  CBC - Abnormal; Notable for the following components:   Hemoglobin 15.4 (*)    All other components within normal limits  RESP PANEL BY RT-PCR (RSV, FLU A&B, COVID)  RVPGX2  PRO BRAIN NATRIURETIC PEPTIDE  BASIC METABOLIC PANEL WITH GFR  CBC  HIV ANTIBODY (ROUTINE TESTING W REFLEX)  TROPONIN T, HIGH SENSITIVITY     EKG  Sinus rhythm with a rate of about 115, axis of 45, intervals appear to be within normal limits, no obvious ischemia appreciated on this EKG   RADIOLOGY No acute cardiopulmonary findings  PROCEDURES:  Critical Care performed: Yes, see critical care procedure note(s)  .Critical Care  Performed by:  Fernand Rossie HERO, MD Authorized by: Fernand Rossie HERO, MD   Critical care provider statement:    Critical care time (minutes):  30   Critical care was time spent personally by me on the following activities:  Development of treatment plan with patient or surrogate, discussions with consultants, evaluation of patient's response to treatment, examination of patient, ordering and review of laboratory studies, ordering and review of radiographic studies, ordering and performing treatments and interventions, pulse oximetry, re-evaluation of patient's condition and review of old charts   Care discussed with: admitting provider      MEDICATIONS ORDERED IN ED: Medications  cefTRIAXone  (ROCEPHIN ) 1 g in sodium chloride  0.9 % 100 mL IVPB (has no administration in time range)  methylPREDNISolone  sodium succinate (SOLU-MEDROL ) 40 mg/mL injection 40 mg (has no administration in time range)    Followed by  predniSONE  (DELTASONE ) tablet 40 mg (has no administration in time range)  enoxaparin  (LOVENOX ) injection 40 mg (has no administration in time  range)  0.9 %  sodium chloride  infusion (has no administration in time range)  acetaminophen  (TYLENOL ) tablet 650 mg (has no administration in time range)    Or  acetaminophen  (TYLENOL ) suppository 650 mg (has no administration in time range)  traZODone  (DESYREL ) tablet 25 mg (has no administration in time range)  magnesium  hydroxide (MILK OF MAGNESIA) suspension 30 mL (has no administration in time range)  ondansetron  (ZOFRAN ) tablet 4 mg (has no administration in time range)    Or  ondansetron  (ZOFRAN ) injection 4 mg (has no administration in time range)  chlorpheniramine-HYDROcodone (TUSSIONEX) 10-8 MG/5ML suspension 5 mL (has no administration in time range)  chlorthalidone  (HYGROTON ) tablet 25 mg (has no administration in time range)  venlafaxine  XR (EFFEXOR -XR) 24 hr capsule 150 mg (has no administration in time range)  gabapentin  (NEURONTIN ) capsule 300 mg (has no administration in time range)  potassium chloride  SA (KLOR-CON  M) CR tablet 40 mEq (has no administration in time range)  ipratropium-albuterol  (DUONEB) 0.5-2.5 (3) MG/3ML nebulizer solution 3 mL (3 mLs Nebulization Given 04/27/24 1654)  methylPREDNISolone  sodium succinate (SOLU-MEDROL ) 125 mg/2 mL injection 125 mg (125 mg Intravenous Given 04/27/24 1653)  ipratropium-albuterol  (DUONEB) 0.5-2.5 (3) MG/3ML nebulizer solution 3 mL (3 mLs Nebulization Given 04/27/24 1809)     IMPRESSION / MDM / ASSESSMENT AND PLAN / ED COURSE  I reviewed the triage vital signs and the nursing notes.                               Patient's presentation is most consistent with acute presentation with potential threat to life or bodily function.  63 year old female presenting today with concern of shortness of breath, clinically appears consistent with likely a COPD exacerbation.  She does have diffuse wheezes in the lower lobes, we will attempt nebulizer here and give his dose of steroids, labs so far are fortunately reassuring and I find ACS  very unlikely given the history and the exam.  Viral panel was also negative here.  Will follow-up nebulizer and steroids and reassess.   Clinical Course as of 04/27/24 2014  Thu Apr 27, 2024  1736 Improved aeration, but continues to require 2 L of oxygen, will give a second nebulizer and reassess if oxygen requirement continues will likely warrant admission. [SK]  1833 Attempted second nebulizer, after this patient continues to require 2 L of oxygen for appropriate O2 saturations.  Reach out to medicine for admission at this time  given the increased oxygen requirement. [SK]  1904 Spoke with Dr. Lawence from medicine team who has agreed to evaluate the patient to determine course of further medical management. [SK]    Clinical Course User Index [SK] Fernand Rossie HERO, MD     FINAL CLINICAL IMPRESSION(S) / ED DIAGNOSES   Final diagnoses:  COPD exacerbation (HCC)     Rx / DC Orders   ED Discharge Orders     None        Note:  This document was prepared using Dragon voice recognition software and may include unintentional dictation errors.   Fernand Rossie HERO, MD 04/27/24 2014  "

## 2024-04-27 NOTE — Assessment & Plan Note (Signed)
 -  Continue antihypertensive therapy ?

## 2024-04-27 NOTE — H&P (Addendum)
 "     [place]   PATIENT NAME: Penny Dickson    MR#:  [id number]  DATE OF BIRTH:  03-23-1961  DATE OF ADMISSION:  04/27/2024  PRIMARY CARE PHYSICIAN: Georgina Calla LABOR, MD   Patient is coming from: Home  REQUESTING/REFERRING PHYSICIAN: Fernand Blase, MD  CHIEF COMPLAINT:   Chief Complaint  Patient presents with   Shortness of Breath    HISTORY OF PRESENT ILLNESS:  Penny Dickson is a 63 y.o. African-American female with medical history significant for COPD, tobacco abuse, dyslipidemia and hypertension as well as major depressive disorder, who presented to the emergency room with acute onset of worsening cough since Monday productive of white sputum as well as dyspnea that significantly worsened since yesterday with associated wheezing.  She denied any fever however has been having chills.  She admitted to nausea without vomiting or diarrhea or abdominal pain.  No chest pain or palpitations.  She has not smoked cigars in 7 days.  She usually smokes about 4 cigars/day.  She has been on Chantix that she stopped last night.  No dysuria, oliguria or hematuria or flank pain.  No bleeding diathesis.  ED Course: When she came to the ER, Pulsoxymeter is 88% on room air and 91% on 2 L of O2 per nasal cannula and later 95%.  Heart rate was 106 and respiratory rate 24 with temperature 98.3 and BP 126/80.  Labs revealed borderline potassium of 3.5 and otherwise unremarkable BMP.  proBNP was 105 and high-sensitivity troponin T was less than 15 CBC was normal except for mild hemoconcentration.  Respiratory panel came back negative EKG as reviewed by me : EKG showed sinus tachycardia with a rate of 122 with PACs. Imaging: 2 view chest x-ray showed no acute cardiopulmonary disease.  The patient was given 125 mg of IV Solu-Medrol  and 2 DuoNebs.  She will be admitted to a telemetry bed for further evaluation and management. PAST MEDICAL HISTORY:   Past Medical History:  Diagnosis Date   COPD  (chronic obstructive pulmonary disease) (HCC)    COPD (chronic obstructive pulmonary disease) (HCC)    Depression    Emphysema lung (HCC)    Heart disease    Hypertension     PAST SURGICAL HISTORY:   Past Surgical History:  Procedure Laterality Date   TYMPANOSTOMY TUBE PLACEMENT Bilateral 10/03/2020    SOCIAL HISTORY:   Social History   Tobacco Use   Smoking status: Every Day    Types: E-cigarettes   Smokeless tobacco: Never  Substance Use Topics   Alcohol use: Never    FAMILY HISTORY:   Family History  Problem Relation Age of Onset   COPD Mother    Heart failure Mother    CAD Father     DRUG ALLERGIES:  Allergies[1]  REVIEW OF SYSTEMS:   ROS As per history of present illness. All pertinent systems were reviewed above. Constitutional, HEENT, cardiovascular, respiratory, GI, GU, musculoskeletal, neuro, psychiatric, endocrine, integumentary and hematologic systems were reviewed and are otherwise negative/unremarkable except for positive findings mentioned above in the HPI.   MEDICATIONS AT HOME:   Prior to Admission medications  Medication Sig Start Date End Date Taking? Authorizing Provider  albuterol  (PROVENTIL ) (2.5 MG/3ML) 0.083% nebulizer solution Take 2.5 mg by nebulization every 4 (four) hours as needed for shortness of breath or cough. 04/29/18   [provider]  albuterol  (VENTOLIN  HFA) 108 (90 Base) MCG/ACT inhaler Inhale into the lungs. 08/25/20   [provider]  chlorthalidone  (  HYGROTON ) 25 MG tablet Take 25 mg by mouth every morning. 01/13/18 02/05/19  [provider]  gabapentin  (NEURONTIN ) 300 MG capsule Take 300 mg by mouth daily. 01/13/18   [provider]  loratadine  (CLARITIN ) 10 MG tablet Take 10 mg by mouth daily.    [provider]  NON FORMULARY cpap device    [provider]  potassium chloride  SA (K-DUR,KLOR-CON ) 20 MEQ tablet Take 40 mEq by mouth daily.  10/29/17 02/05/19  [provider]  TRELEGY ELLIPTA  100-62.5-25 MCG/ACT AEPB INHALE 1 PUFF BY MOUTH EVERY DAY 06/04/22   Fernand Elfreda LABOR, MD  venlafaxine  (EFFEXOR ) 75 MG tablet Take 75 mg by mouth daily. 10/28/17   [provider]  venlafaxine  XR (EFFEXOR -XR) 150 MG 24 hr capsule Take 150 mg by mouth daily. 10/28/17   [provider]      VITAL SIGNS:  Blood pressure 139/84, pulse (!) 102, temperature 99 F (37.2 C), resp. rate 20, SpO2 94%.  PHYSICAL EXAMINATION:  Physical Exam  GENERAL:  63 y.o.-year-old African-American female patient lying in the bed with mild respiratory distress with conversational dyspnea. EYES: Pupils equal, round, reactive to light and accommodation. No scleral icterus. Extraocular muscles intact.  HEENT: Head atraumatic, normocephalic. Oropharynx and nasopharynx clear.  NECK:  Supple, no jugular venous distention. No thyroid enlargement, no tenderness.  LUNGS: Diffuse expiratory wheezes with tight expiratory airflow and harsh vesicular breathing. CARDIOVASCULAR: Regular rate and rhythm, S1, S2 normal. No murmurs, rubs, or gallops.  ABDOMEN: Soft, nondistended, nontender. Bowel sounds present. No organomegaly or mass.  EXTREMITIES: No pedal edema, cyanosis, or clubbing.  NEUROLOGIC: Cranial nerves II through XII are intact. Muscle strength 5/5 in all extremities. Sensation intact. Gait not checked.  PSYCHIATRIC: The patient is alert and oriented x 3.  Normal affect and good eye contact. SKIN: No obvious rash, lesion, or ulcer.   LABORATORY PANEL:   CBC Recent Labs  Lab 04/27/24 1334  WBC 5.0  HGB 15.4*  HCT 45.1  PLT 236   ------------------------------------------------------------------------------------------------------------------  Chemistries  Recent Labs  Lab 04/27/24 1334  NA 137  K 3.5  CL 97*  CO2 27  GLUCOSE 112*  BUN 13  CREATININE 1.17*  CALCIUM 8.9    ------------------------------------------------------------------------------------------------------------------  Cardiac Enzymes No results for input(s): TROPONINI in the last 168 hours. ------------------------------------------------------------------------------------------------------------------  RADIOLOGY:  DG Chest 2 View Result Date: 04/27/2024 CLINICAL DATA:  Shortness of breath. EXAM: CHEST - 2 VIEW COMPARISON:  May 16, 2018 FINDINGS: The heart size and mediastinal contours are within normal limits. Both lungs are clear. The visualized skeletal structures are unremarkable. IMPRESSION: No active cardiopulmonary disease. Electronically Signed   By: Suzen Dials M.D.   On: 04/27/2024 14:01      IMPRESSION AND PLAN:  Assessment and Plan: * COPD exacerbation (HCC) - The patient will be admitted to a medically monitored bed. - We will place the patient IV steroid therapy with IV Solu-Medrol  as well as nebulized bronchodilator therapy with duonebs q.i.d. and q.4 hours p.r.n.SABRA - Antibiotic therapy with IV Rocephin  with as needed antitussives. - O2 protocol will be followed. - Will hold off long-acting beta agonist.   Acute respiratory failure with hypoxia (HCC) - This is clearly secondary to #1. - Management as above.  Tobacco abuse - She was counseled for cigar smoking cessation and will receive further counseling here.  Essential hypertension - Continue antihypertensive therapy.  Major depressive disorder - We will continue Effexor  XR.  Peripheral neuropathy - Will continue  Neurontin .   DVT prophylaxis: Lovenox .  Advanced Care Planning:  Code Status: She is DNR only.  This was discussed with her. Family Communication:  The plan of care was discussed in details with the patient (and family). I answered all questions. The patient agreed to proceed with the above mentioned plan. Further management will depend upon hospital course. Disposition Plan: Back  to previous home environment Consults called: none.  All the records are reviewed and case discussed with ED provider.  Status is: Inpatient  At the time of the admission, it appears that the appropriate admission status for this patient is inpatient.  This is judged to be reasonable and necessary in order to provide the required intensity of service to ensure the patient's safety given the presenting symptoms, physical exam findings and initial radiographic and laboratory data in the context of comorbid conditions.  The patient requires inpatient status due to high intensity of service, high risk of further deterioration and high frequency of surveillance required.  I certify that at the time of admission, it is my clinical judgment that the patient will require inpatient hospital care extending more than 2 midnights.                            Dispo: The patient is from: Home              Anticipated d/c is to: Home              Patient currently is not medically stable to d/c.              Difficult to place patient: No  [place]ison DELENA Peaches M.D on 04/27/2024 at 8:19 PM  [facility]   From 7 PM-7 AM, contact night-coverage [email or web address]  CC: Primary care physician; Georgina Calla DELENA, MD     [1]  Allergies Allergen Reactions   Mucinex  [Guaifenesin  Er] Swelling    Per pt this has caused oral swelling in the past   "

## 2024-04-27 NOTE — Assessment & Plan Note (Addendum)
-   The patient will be admitted to a medically monitored bed. - We will place the patient IV steroid therapy with IV Solu-Medrol  as well as nebulized bronchodilator therapy with duonebs q.i.d. and q.4 hours p.r.n.SABRA - Antibiotic therapy with IV Rocephin  with as needed antitussives. - O2 protocol will be followed. - Will hold off long-acting beta agonist.

## 2024-04-27 NOTE — Assessment & Plan Note (Signed)
-   This is clearly secondary to #1. - Management as above. 

## 2024-04-27 NOTE — Assessment & Plan Note (Signed)
-   She was counseled for cigar smoking cessation and will receive further counseling here.

## 2024-04-27 NOTE — ED Triage Notes (Signed)
 First nurse note: pt to ED [facility] from home for shob started Monday. +cough. 93% RA with EMS Hx COPD, albuterol  PTA

## 2024-04-27 NOTE — Progress Notes (Signed)
 PHARMACIST - PHYSICIAN COMMUNICATION  CONCERNING:  Enoxaparin  (Lovenox ) for DVT Prophylaxis    RECOMMENDATION: Patient was prescribed enoxaprin 40mg  q24 hours for VTE prophylaxis.   Filed Weights   04/27/24 1952  Weight: 94.8 kg (208 lb 15.9 oz)    Body mass index is 37.02 kg/m.  Estimated Creatinine Clearance: 53.2 mL/min (A) (by C-G formula based on SCr of 1.17 mg/dL (H)).   Based on [facility] policy patient is candidate for enoxaparin  0.5mg /kg TBW SQ every 24 hours based on BMI being >30.  DESCRIPTION: Pharmacy has adjusted enoxaparin  dose per [facility] policy.  Patient is now receiving enoxaparin  0.5 mg/kg every 24 hours   [place] CANDIE Dills, PharmD, [facility] 04/27/2024 10:51 PM

## 2024-04-27 NOTE — Assessment & Plan Note (Signed)
-   Will continue Neurontin .

## 2024-04-27 NOTE — Assessment & Plan Note (Signed)
-   We will continue Effexor XR. 

## 2024-04-27 NOTE — ED Triage Notes (Signed)
 Pt to ED via [facility] from home. Pt reports SOB that started on Monday and was worse yesterday and no better today. Pt reports with ambulation oxygen has been in the 80s. Pt reports hx of COPD with no at home oxygen use. Pt also endorses cough. Pt oxygen 88% RA. Placed on 2L [place]

## 2024-04-28 ENCOUNTER — Other Ambulatory Visit: Payer: Self-pay

## 2024-04-28 DIAGNOSIS — E669 Obesity, unspecified: Secondary | ICD-10-CM | POA: Insufficient documentation

## 2024-04-28 DIAGNOSIS — J441 Chronic obstructive pulmonary disease with (acute) exacerbation: Secondary | ICD-10-CM | POA: Diagnosis not present

## 2024-04-28 LAB — RESPIRATORY PANEL BY PCR

## 2024-04-28 LAB — CBC
HCT: 43.9 % (ref 36.0–46.0)
Hemoglobin: 14.8 g/dL (ref 12.0–15.0)
MCH: 29.7 pg (ref 26.0–34.0)
MCHC: 33.7 g/dL (ref 30.0–36.0)
MCV: 88 fL (ref 80.0–100.0)
Platelets: 247 K/uL (ref 150–400)
RBC: 4.99 MIL/uL (ref 3.87–5.11)
RDW: 12.1 % (ref 11.5–15.5)
WBC: 3.6 K/uL — ABNORMAL LOW (ref 4.0–10.5)
nRBC: 0 % (ref 0.0–0.2)

## 2024-04-28 LAB — BASIC METABOLIC PANEL WITH GFR
Anion gap: 11 (ref 5–15)
BUN: 17 mg/dL (ref 8–23)
CO2: 27 mmol/L (ref 22–32)
Calcium: 8.9 mg/dL (ref 8.9–10.3)
Chloride: 102 mmol/L (ref 98–111)
Creatinine, Ser: 0.99 mg/dL (ref 0.44–1.00)
GFR, Estimated: >60 mL/min
Glucose, Bld: 150 mg/dL — ABNORMAL HIGH (ref 70–99)
Potassium: 3.8 mmol/L (ref 3.5–5.1)
Sodium: 141 mmol/L (ref 135–145)

## 2024-04-28 LAB — HIV ANTIBODY (ROUTINE TESTING W REFLEX): HIV Screen 4th Generation wRfx: NONREACTIVE

## 2024-04-28 MED ORDER — PREDNISONE 10 MG PO TABS
40.0000 mg | ORAL_TABLET | Freq: Every day | ORAL | 0 refills | Status: AC
  Filled 2024-04-28: qty 20, 5d supply, fill #0

## 2024-04-28 MED ORDER — AZITHROMYCIN 250 MG PO TABS
ORAL_TABLET | ORAL | 0 refills | Status: AC
  Filled 2024-04-28: qty 6, 5d supply, fill #0

## 2024-04-28 NOTE — Care Management CC44 (Signed)
"         Condition Code 44 Documentation Completed  Patient Details  Name: Penny Dickson MRN: [id number] Date of Birth: [birth year]   Condition Code 44 given:   yes Patient signature on Condition Code 44 notice:   yes Documentation of 2 MD's agreement:   yes Code 44 added to claim:  yes     Alfonso Rummer, LCSW 04/28/2024, 3:06 PM  "

## 2024-04-28 NOTE — Progress Notes (Signed)
 20-pathogen respiratory viral panel re-collected and sent to lab after obtaining patient's verbal consent for collection.

## 2024-04-28 NOTE — Discharge Summary (Signed)
 Penny Dickson:[id number] DOB: 27-Oct-1960 DOA: 04/27/2024  PCP: Georgina Calla LABOR, MD  Admit date: 04/27/2024 Discharge date: 04/28/2024  Time spent: 35 minutes  Recommendations for Outpatient Follow-up:  Pcp f/u 1 week     Discharge Diagnoses:  Principal Problem:   COPD exacerbation (HCC) Active Problems:   Acute respiratory failure with hypoxia (HCC)   Tobacco abuse   Essential hypertension   Major depressive disorder   Peripheral neuropathy   Obesity (BMI 30-39.9)   Discharge Condition: improving  Diet recommendation: heart healthy  Filed Weights   04/27/24 1952  Weight: 94.8 kg    History of present illness:  From admission h and p Penny Dickson is a 63 y.o. African-American female with medical history significant for COPD, tobacco abuse, dyslipidemia and hypertension as well as major depressive disorder, who presented to the emergency room with acute onset of worsening cough since Monday productive of white sputum as well as dyspnea that significantly worsened since yesterday with associated wheezing.  She denied any fever however has been having chills.  She admitted to nausea without vomiting or diarrhea or abdominal pain.  No chest pain or palpitations.  She has not smoked cigars in 7 days.  She usually smokes about 4 cigars/day.  She has been on Chantix that she stopped last night.  No dysuria, oliguria or hematuria or flank pain.  No bleeding diathesis.   Hospital Course:   Patient presents with several days dyspnea on exertion. No chest pain. No productive cough. Cxr nothing focal. Covid/flu/rsv neg. No sig lab abnormalities. Hypoxic to 80s on ambulation. Admitted for copd exacerbation. Treated with steroids and ceftriaxone . By hospital day 1 patient weaned to room air. Ambulated without desaturation or significant problems. Patient requested transfer to [facility] (says gets all care there and is only here because EMS took her here). I offered to call [facility]  and request transfer but explained they would need to agree to accept, and that typically the answer is no unless they offer a service unavailable at our current facility. Patient then declined my offer to attempt transfer. Patient then requested being sent home with oxygen. I explained that insurance will only pay for oxygen for those who qualify, and given her normal oxygen at rest and with ambulation, she would not qualify. Patient then requested discharge. As she is breathing comfortably on room air, ambulating without difficulty, she is stable for discharge and so I obliged this request. Will d/c with 5 days prednisone  and azithromycin  for copd exacerbation, advise close pcp f/u. 20-pathogen respiratory viral panel ordered; nurse obtained but forgot to label, so lab told her needs to be re-collected. Nurse will offer to re-collect, and if patient assents, we will send and follow up the results.   Procedures: none   Consultations: none  Discharge Exam: Vitals:   04/28/24 0335 04/28/24 0833  BP: 110/69 (!) 113/95  Pulse: 97 87  Resp: 20 19  Temp: (!) 97.4 F (36.3 C) 97.8 F (36.6 C)  SpO2: 97% 98%    General: NAD Cardiovascular: RRR Respiratory: few faint rhonchi, no wheeze, normal wob  Discharge Instructions   Discharge Instructions     Increase activity slowly   Complete by: As directed       Allergies as of 04/28/2024       Reactions   Mucinex  [guaifenesin  Er] Swelling   Per pt this has caused oral swelling in the past        Medication List  TAKE these medications    albuterol  (2.5 MG/3ML) 0.083% nebulizer solution Commonly known as: PROVENTIL  Take 2.5 mg by nebulization every 4 (four) hours as needed for shortness of breath or cough.   albuterol  108 (90 Base) MCG/ACT inhaler Commonly known as: VENTOLIN  HFA Inhale into the lungs.   azithromycin  250 MG tablet Commonly known as: ZITHROMAX  Take as directed: Two pills by mouth the first day, then one  pill every day until completed   chlorthalidone  25 MG tablet Commonly known as: HYGROTON  Take 25 mg by mouth every morning.   gabapentin  300 MG capsule Commonly known as: NEURONTIN  Take 300 mg by mouth daily.   loratadine  10 MG tablet Commonly known as: CLARITIN  Take 10 mg by mouth daily.   NON FORMULARY cpap device   potassium chloride  SA 20 MEQ tablet Commonly known as: KLOR-CON  M Take 40 mEq by mouth daily.   predniSONE  10 MG tablet Commonly known as: DELTASONE  Take 4 tablets (40 mg total) by mouth daily for 5 days.   Trelegy Ellipta  100-62.5-25 MCG/ACT Aepb Generic drug: Fluticasone-Umeclidin-Vilant INHALE 1 PUFF BY MOUTH EVERY DAY   venlafaxine  75 MG tablet Commonly known as: EFFEXOR  Take 75 mg by mouth daily.   venlafaxine  XR 150 MG 24 hr capsule Commonly known as: EFFEXOR -XR Take 150 mg by mouth daily.       Allergies[1]  Follow-up Information     Georgina Calla LABOR, MD Follow up.   Specialty: Family Medicine Why: 1 week Contact information: [address] [place] [address] [phone]                  The results of significant diagnostics from this hospitalization (including imaging, microbiology, ancillary and laboratory) are listed below for reference.    Significant Diagnostic Studies: DG Chest 2 View Result Date: 04/27/2024 CLINICAL DATA:  Shortness of breath. EXAM: CHEST - 2 VIEW COMPARISON:  May 16, 2018 FINDINGS: The heart size and mediastinal contours are within normal limits. Both lungs are clear. The visualized skeletal structures are unremarkable. IMPRESSION: No active cardiopulmonary disease. Electronically Signed   By: Suzen Dials M.D.   On: 04/27/2024 14:01    Microbiology: Recent Results (from the past 240 hours)  Resp panel by RT-PCR (RSV, Flu A&B, Covid) Anterior Nasal Swab     Status: None   Collection Time: 04/27/24  1:34 PM   Specimen: Anterior Nasal Swab  Result Value Ref Range Status   SARS  Coronavirus 2 by RT PCR NEGATIVE NEGATIVE Final    Comment: (NOTE) SARS-CoV-2 target nucleic acids are NOT DETECTED.  The SARS-CoV-2 RNA is generally detectable in upper respiratory specimens during the acute phase of infection. The lowest concentration of SARS-CoV-2 viral copies this assay can detect is 138 copies/mL. A negative result does not preclude SARS-Cov-2 infection and should not be used as the sole basis for treatment or other patient management decisions. A negative result may occur with  improper specimen collection/handling, submission of specimen other than nasopharyngeal swab, presence of viral mutation(s) within the areas targeted by this assay, and inadequate number of viral copies(<138 copies/mL). A negative result must be combined with clinical observations, patient history, and epidemiological information. The expected result is Negative.  Fact Sheet for Patients:  bloggercourse.com  Fact Sheet for Healthcare Providers:  seriousbroker.it  This test is no t yet approved or cleared by the [place]  FDA and  has been authorized for detection and/or diagnosis of SARS-CoV-2 by FDA under an Emergency Use Authorization (  EUA). This EUA will remain  in effect (meaning this test can be used) for the duration of the COVID-19 declaration under Section 564(b)(1) of the Act, 21 U.S.C.section 360bbb-3(b)(1), unless the authorization is terminated  or revoked sooner.       Influenza A by PCR NEGATIVE NEGATIVE Final   Influenza B by PCR NEGATIVE NEGATIVE Final    Comment: (NOTE) The Xpert Xpress SARS-CoV-2/FLU/RSV plus assay is intended as an aid in the diagnosis of influenza from Nasopharyngeal swab specimens and should not be used as a sole basis for treatment. Nasal washings and aspirates are unacceptable for Xpert Xpress SARS-CoV-2/FLU/RSV testing.  Fact Sheet for  Patients: bloggercourse.com  Fact Sheet for Healthcare Providers: seriousbroker.it  This test is not yet approved or cleared by the [place]  FDA and has been authorized for detection and/or diagnosis of SARS-CoV-2 by FDA under an Emergency Use Authorization (EUA). This EUA will remain in effect (meaning this test can be used) for the duration of the COVID-19 declaration under Section 564(b)(1) of the Act, 21 U.S.C. section 360bbb-3(b)(1), unless the authorization is terminated or revoked.     Resp Syncytial Virus by PCR NEGATIVE NEGATIVE Final    Comment: (NOTE) Fact Sheet for Patients: bloggercourse.com  Fact Sheet for Healthcare Providers: seriousbroker.it  This test is not yet approved or cleared by the [place]  FDA and has been authorized for detection and/or diagnosis of SARS-CoV-2 by FDA under an Emergency Use Authorization (EUA). This EUA will remain in effect (meaning this test can be used) for the duration of the COVID-19 declaration under Section 564(b)(1) of the Act, 21 U.S.C. section 360bbb-3(b)(1), unless the authorization is terminated or revoked.  Performed at [facility], [address] [place], [place] [address]      Labs: Basic Metabolic Panel: Recent Labs  Lab 04/27/24 1334 04/28/24 0527  NA 137 141  K 3.5 3.8  CL 97* 102  CO2 27 27  GLUCOSE 112* 150*  BUN 13 17  CREATININE 1.17* 0.99  CALCIUM 8.9 8.9   Liver Function Tests: No results for input(s): AST, ALT, ALKPHOS, BILITOT, PROT, ALBUMIN in the last 168 hours. No results for input(s): LIPASE, AMYLASE in the last 168 hours. No results for input(s): AMMONIA in the last 168 hours. CBC: Recent Labs  Lab 04/27/24 1334 04/28/24 0527  WBC 5.0 3.6*  HGB 15.4* 14.8  HCT 45.1 43.9  MCV 88.8 88.0  PLT 236 247   Cardiac Enzymes: No results for input(s):  CKTOTAL, CKMB, CKMBINDEX, TROPONINI in the last 168 hours. BNP: BNP (last 3 results) No results for input(s): BNP in the last 8760 hours.  ProBNP (last 3 results) Recent Labs    04/27/24 1700  PROBNP 105.0    CBG: No results for input(s): GLUCAP in the last 168 hours.     Signed:  Devaughn KATHEE Ban MD.  [facility] 04/28/2024, 1:45 PM     [1]  Allergies Allergen Reactions   Mucinex  [Guaifenesin  Er] Swelling    Per pt this has caused oral swelling in the past

## 2024-04-28 NOTE — Care Management Important Message (Signed)
 Important Message  Patient Details  Name: Penny Dickson MRN: [id number] Date of Birth: 1960/10/01   Important Message Given:   Code 44 given to patient and verbalized understanding      Alfonso Rummer, LCSW 04/28/2024, 3:06 PM

## 2024-04-28 NOTE — Plan of Care (Signed)
" °  Problem: Education: Goal: Knowledge of General Education information will improve Description: Including pain rating scale, medication(s)/side effects and non-pharmacologic comfort measures Outcome: Progressing   Problem: Clinical Measurements: Goal: Ability to maintain clinical measurements within normal limits will improve Outcome: Progressing   Problem: Clinical Measurements: Goal: Will remain free from infection Outcome: Progressing   Problem: Clinical Measurements: Goal: Diagnostic test results will improve Outcome: Progressing   Problem: Clinical Measurements: Goal: Respiratory complications will improve Outcome: Progressing   Problem: Activity: Goal: Risk for activity intolerance will decrease Outcome: Progressing   Problem: Pain Managment: Goal: General experience of comfort will improve and/or be controlled Outcome: Progressing   Problem: Safety: Goal: Ability to remain free from injury will improve Outcome: Progressing   "

## 2024-04-28 NOTE — TOC Transition Note (Signed)
 Transition of Care [facility]) - Discharge Note   Patient Details  Name: Penny Dickson MRN: [id number] Date of Birth: 05-29-1960  [facility] CM/SW Contact:  Alfonso Rummer, LCSW Phone Number: 04/28/2024, 3:12 PM   Clinical Narrative:     Pt will discharge home no toc needs.         Patient Goals and CMS Choice            Discharge Placement                       Discharge Plan and Services Additional resources added to the After Visit Summary for                                       Social Drivers of Health (SDOH) Interventions SDOH Screenings   Food Insecurity: No Food Insecurity (04/27/2024)  Housing: Low Risk (04/27/2024)  Transportation Needs: No Transportation Needs (04/27/2024)  Utilities: Not At Risk (04/27/2024)  Financial Resource Strain: Low Risk (12/07/2023)   Received from [facility]  Social Connections: Unknown (04/27/2024)  Tobacco Use: High Risk (04/27/2024)     Readmission Risk Interventions     No data to display
